# Patient Record
Sex: Male | Born: 1970 | Race: White | Hispanic: No | Marital: Married | Smoking: Never smoker
Health system: Southern US, Community
[De-identification: ages and names within clinical notes are randomized; demographics above are authoritative.]

## PROBLEM LIST (undated history)

## (undated) DIAGNOSIS — I10 Essential (primary) hypertension: Secondary | ICD-10-CM

## (undated) DIAGNOSIS — N200 Calculus of kidney: Secondary | ICD-10-CM

## (undated) HISTORY — DX: Calculus of kidney: N20.0

## (undated) HISTORY — PX: ANKLE FRACTURE SURGERY: SHX122

## (undated) HISTORY — DX: Essential (primary) hypertension: I10

## (undated) HISTORY — PX: TONSILLECTOMY: SUR1361

---

## 2001-03-31 ENCOUNTER — Emergency Department (HOSPITAL_COMMUNITY): Admission: EM | Admit: 2001-03-31 | Discharge: 2001-03-31 | Payer: Self-pay | Admitting: Emergency Medicine

## 2014-10-14 ENCOUNTER — Encounter: Payer: Self-pay | Admitting: Family Medicine

## 2014-10-14 ENCOUNTER — Ambulatory Visit (INDEPENDENT_AMBULATORY_CARE_PROVIDER_SITE_OTHER): Payer: Managed Care, Other (non HMO) | Admitting: Family Medicine

## 2014-10-14 VITALS — BP 132/100 | HR 79 | Temp 97.9°F | Ht 69.5 in | Wt 252.4 lb

## 2014-10-14 DIAGNOSIS — I10 Essential (primary) hypertension: Secondary | ICD-10-CM

## 2014-10-14 DIAGNOSIS — K219 Gastro-esophageal reflux disease without esophagitis: Secondary | ICD-10-CM

## 2014-10-14 DIAGNOSIS — E669 Obesity, unspecified: Secondary | ICD-10-CM

## 2014-10-14 DIAGNOSIS — Z23 Encounter for immunization: Secondary | ICD-10-CM

## 2014-10-14 MED ORDER — LISINOPRIL-HYDROCHLOROTHIAZIDE 10-12.5 MG PO TABS: 1.0000 | ORAL_TABLET | Freq: Every day | ORAL | Status: DC

## 2014-10-14 NOTE — Patient Instructions (Signed)
BEFORE YOU LEAVE: -schedule physical in 1-2 months and follow up for you blood pressure -flu shot today  -stop the supplements  -RESTART the blood pressure medication  -try trial off of the acid reflux medication  We recommend the following healthy lifestyle measures: - eat a healthy diet consisting of lots of vegetables, fruits, beans, nuts, seeds, healthy meats such as white chicken and fish and whole grains.  - avoid fried foods, fast food, processed foods, sodas, red meet and other fattening foods.  - get a least 150 minutes of aerobic exercise per week.

## 2014-10-14 NOTE — Progress Notes (Signed)
HPI:  Douglas Webb is here to establish care. Used to go to urgent care. Friend is Dr. Phoebe Perchtelin - a urologist and he always helps him if needs anything. But now has HTN and needs PCP. Has the following chronic problems that require follow up and concerns today:  HTN: -dx last year -has been working on diet and exercise -lisinopril-hctz prescribed in urgent care - he ran out 4 days ago -doing 3 miles on treadmill daily, working on diet, has lost 24 lbs on this regimen in 4 months -denies: CP, SOB, DOE, swelling, palpitations, HA -FH hypertension in father  GERD: -on ppi -no heartburn or dysphagia  Allergic Rhinitis: -takes flonase -allovert -reports is doing well currently  ROS negative for unless reported above: fevers, unintentional weight loss, hearing or vision loss, chest pain, palpitations, struggling to breath, hemoptysis, melena, hematochezia, hematuria, falls, loc, si, thoughts of self harm  Past Medical History  Diagnosis Date  . Hypertension   . Kidney stone     Past Surgical History  Procedure Laterality Date  . Tonsillectomy    . Ankle fracture surgery Bilateral     Family History  Problem Relation Age of Onset  . Hypertension Father   . Cancer Father     lung cancer, smoker    History   Social History  . Marital Status: Married    Spouse Name: N/A    Number of Children: N/A  . Years of Education: N/A   Social History Main Topics  . Smoking status: Never Smoker   . Smokeless tobacco: None  . Alcohol Use: 0.0 oz/week    0 Not specified per week     Comment: occ rare alcohol use  . Drug Use: No  . Sexual Activity: None   Other Topics Concern  . None   Social History Narrative   Work or School: Horticulturist, [employer], Programmer, [employer] [place]      Home Situation: wife, son, 2 step kids      Spiritual Beliefs: none, catholic      Lifestyle: 3 miles per day of exercise; healthy diet          Current outpatient prescriptions:  fluticasone (FLONASE) 50 MCG/ACT nasal spray, Place into both nostrils daily., Disp: , Rfl: ;  lisinopril-hydrochlorothiazide (PRINZIDE,ZESTORETIC) 10-12.5 MG per tablet, Take 1 tablet by mouth daily., Disp: 90 tablet, Rfl: 3;  NON FORMULARY, Juice plus, Disp: , Rfl: ;  NON FORMULARY, Forskohlii, Disp: , Rfl: ;  NON FORMULARY, Isagenix lean shake and cleanse, Disp: , Rfl:  omeprazole (PRILOSEC) 20 MG capsule, Take 20 mg by mouth daily., Disp: , Rfl:   EXAM:  Filed Vitals:   10/14/14 1440  BP: 132/100  Pulse: 79  Temp: 97.9 F (36.6 C)    Body mass index is 36.75 kg/(m^2).  GENERAL: vitals reviewed and listed above, alert, oriented, appears well hydrated and in no acute distress  HEENT: atraumatic, conjunttiva clear, no obvious abnormalities on inspection of external nose and ears  NECK: no obvious masses on inspection  LUNGS: clear to auscultation bilaterally, no wheezes, rales or rhonchi, good air movement  CV: HRRR, no peripheral edema  MS: moves all extremities without noticeable abnormality  PSYCH: pleasant and cooperative, no obvious depression or anxiety  ASSESSMENT AND PLAN:  Discussed the following assessment and plan:  Essential hypertension - Plan: lisinopril-hydrochlorothiazide (PRINZIDE,ZESTORETIC) 10-12.5 MG per tablet  Obesity  Gastroesophageal reflux disease, esophagitis presence not specified   -We reviewed the PMH, PSH, FH,  SH, Meds and Allergies. -We provided refills for any medications we will prescribe as needed. -We addressed current concerns per orders and patient instructions. -We have asked for records for pertinent exams, studies, vaccines and notes from previous providers. -We have advised patient to follow up per instructions below. -restart BP meds, congratulated and encourage on lifestyle changes and supported at length -close follow up on BP and physical -discussed risks with supplement quality and advised to stop supplements -advised  trial of PPI and close follow up  -Patient advised to return or notify a doctor immediately if symptoms worsen or persist or new concerns arise.  Patient Instructions  BEFORE YOU LEAVE: -schedule physical in 1-2 months and follow up for you blood pressure -flu shot today  -stop the supplements  -RESTART the blood pressure medication  -try trial off of the acid reflux medication  We recommend the following healthy lifestyle measures: - eat a healthy diet consisting of lots of vegetables, fruits, beans, nuts, seeds, healthy meats such as white chicken and fish and whole grains.  - avoid fried foods, fast food, processed foods, sodas, red meet and other fattening foods.  - get a least 150 minutes of aerobic exercise per week.        Kriste BasqueKIM, Bryla Burek R.

## 2014-10-14 NOTE — Addendum Note (Signed)
Addended by: Johnella MoloneyFUNDERBURK, Gaylynn Seiple A on: 10/14/2014 03:36 PM   Modules accepted: Orders

## 2014-10-14 NOTE — Progress Notes (Signed)
Pre visit review using our clinic review tool, if applicable. No additional management support is needed unless otherwise documented below in the visit note. 

## 2014-10-20 ENCOUNTER — Telehealth: Payer: Self-pay | Admitting: Family Medicine

## 2014-10-20 NOTE — Telephone Encounter (Signed)
emmi emailed °

## 2014-12-16 ENCOUNTER — Ambulatory Visit (INDEPENDENT_AMBULATORY_CARE_PROVIDER_SITE_OTHER): Payer: Managed Care, Other (non HMO) | Admitting: Family Medicine

## 2014-12-16 ENCOUNTER — Encounter: Payer: Self-pay | Admitting: Family Medicine

## 2014-12-16 VITALS — BP 122/86 | HR 70 | Temp 97.9°F | Ht 69.5 in | Wt 260.1 lb

## 2014-12-16 DIAGNOSIS — I10 Essential (primary) hypertension: Secondary | ICD-10-CM

## 2014-12-16 DIAGNOSIS — Z0000 Encounter for general adult medical examination without abnormal findings: Secondary | ICD-10-CM

## 2014-12-16 DIAGNOSIS — W540XXA Bitten by dog, initial encounter: Secondary | ICD-10-CM

## 2014-12-16 DIAGNOSIS — Z23 Encounter for immunization: Secondary | ICD-10-CM

## 2014-12-16 DIAGNOSIS — T148 Other injury of unspecified body region: Secondary | ICD-10-CM

## 2014-12-16 DIAGNOSIS — E669 Obesity, unspecified: Secondary | ICD-10-CM

## 2014-12-16 NOTE — Patient Instructions (Addendum)
BEFORE YOU LEAVE: -schedule fasting labs - drink plenty of water that morning -follow up appointment in 3 months -Tdap  We recommend the following healthy lifestyle measures: - eat a healthy diet consisting of lots of vegetables, fruits, beans, nuts, seeds, healthy meats such as white chicken and fish and whole grains.  - avoid fried foods, fast food, processed foods, sodas, red meet and other fattening foods.  - get a least 150 minutes of aerobic exercise per week.   Seek care immediately if wound on hand worsening or not healing

## 2014-12-16 NOTE — Progress Notes (Signed)
Pre visit review using our clinic review tool, if applicable. No additional management support is needed unless otherwise documented below in the visit note. 

## 2014-12-16 NOTE — Addendum Note (Signed)
Addended by: Sallee LangeFUNDERBURK, JO A on: 12/16/2014 11:40 AM   Modules accepted: Orders

## 2014-12-16 NOTE — Progress Notes (Signed)
HPI:  Here for CPE:  -Concerns and/or follow up today:   HTN: -dx last year -reports was bad over the holiday, but better now -lisinopril-hctz  -doing 3 miles on treadmill daily, working on diet, has lost 24 lbs on this regimen in 4 months, then gained some back over holidays -denies: CP, SOB, DOE, swelling, palpitations, HA -FH hypertension in father  GERD: -on ppi -no heartburn or dysphagia  Allergic Rhinitis: -takes flonase -allovert -reports is doing well currently  Dog bite: R thumb Cleaned and treated in ucc and reports healing well Denies: pain, trouble bending finger, redness, swelling  -Diet: variety of foods, balance and well rounded - since new year healthy  -Exercise: regular exercise - 3 miles per day treadmill started recently  -Diabetes and Dyslipidemia Screening: not fasting  -Hx of HTN: no  -Vaccines: Tdap offered  -sexual activity: yes, male partner, no new partners  -wants STI testing, Hep C screening (if born 63-1966): no  -FH colon or prstate ca: see FH Last colon cancer screening: n/a Last prostate ca screening: n/a  -Alcohol, Tobacco, drug use: see social history  Review of Systems - no fevers, unintentional weight loss, vision loss, hearing loss, chest pain, sob, hemoptysis, melena, hematochezia, hematuria, genital discharge, changing or concerning skin lesions, bleeding, bruising, loc, thoughts of self harm or SI  Past Medical History  Diagnosis Date  . Hypertension   . Kidney stone     Past Surgical History  Procedure Laterality Date  . Tonsillectomy    . Ankle fracture surgery Bilateral     Family History  Problem Relation Age of Onset  . Hypertension Father   . Cancer Father     lung cancer, smoker    History   Social History  . Marital Status: Married    Spouse Name: N/A    Number of Children: N/A  . Years of Education: N/A   Social History Main Topics  . Smoking status: Never Smoker   . Smokeless  tobacco: None  . Alcohol Use: 0.0 oz/week    0 Not specified per week     Comment: occ rare alcohol use  . Drug Use: No  . Sexual Activity: None   Other Topics Concern  . None   Social History Narrative   Work or School: Horticulturist, commercial, Programmer, [employer] [place]      Home Situation: wife, son, 2 step kids      Spiritual Beliefs: none, catholic      Lifestyle: 3 miles per day of exercise; healthy diet           Current outpatient prescriptions:  .  fluticasone (FLONASE) 50 MCG/ACT nasal spray, Place into both nostrils daily., Disp: , Rfl:  .  lisinopril-hydrochlorothiazide (PRINZIDE,ZESTORETIC) 10-12.5 MG per tablet, Take 1 tablet by mouth daily., Disp: 90 tablet, Rfl: 3 .  NON FORMULARY, Juice plus, Disp: , Rfl:  .  NON FORMULARY, Isagenix lean shake and cleanse, Disp: , Rfl:  .  omeprazole (PRILOSEC) 20 MG capsule, Take 20 mg by mouth daily., Disp: , Rfl:   EXAM:  Filed Vitals:   12/16/14 0933  BP: 122/86  Pulse: 70  Temp: 97.9 F (36.6 C)  TempSrc: Oral  Height: 5' 9.5" (1.765 m)  Weight: 260 lb 1.6 oz (117.981 kg)    Estimated body mass index is 37.87 kg/(m^2) as calculated from the following:   Height as of this encounter: 5' 9.5" (1.765 m).   Weight as of this encounter: 260 lb  1.6 oz (117.981 kg).  GENERAL: vitals reviewed and listed below, alert, oriented, appears well hydrated and in no acute distress  HEENT: head atraumatic, PERRLA, normal appearance of eyes, ears, nose and mouth. moist mucus membranes.  NECK: supple, no masses or lymphadenopathy  LUNGS: clear to auscultation bilaterally, no rales, rhonchi or wheeze  CV: HRRR, no peripheral edema or cyanosis, normal pedal pulses  ABDOMEN: bowel sounds normal, soft, non tender to palpation, no masses, no rebound or guarding  GU: declined  RECTAL: refused  SKIN: healing lesion R thumb, no erythema or welling or signs of infection  MS: normal gait, moves all extremities normally  NEURO:  CN II-XII grossly intact, normal muscle strength and sensation to light touch on extremities  PSYCH: normal affect, pleasant and cooperative  ASSESSMENT AND PLAN:  Discussed the following assessment and plan:  Essential hypertension - Plan: Basic metabolic panel  Visit for preventive health examination - Plan: Lipid Panel, Hemoglobin A1c  Obesity - Plan: Lipid Panel, Hemoglobin A1c  Dog bite   -Discussed and advised all [place] preventive services health task force level A and B recommendations for age, sex and risks.  -Advised at least 150 minutes of exercise per week and a healthy diet low in saturated fats and sweets and consisting of fresh fruits and vegetables, lean meats such as fish and white chicken and whole grains.  -FASTING labs, studies and vaccines per orders this encounter   Patient advised to return to clinic immediately if symptoms worsen or persist or new concerns.  Patient Instructions  BEFORE YOU LEAVE: -schedule fasting labs - drink plenty of water that morning -follow up appointment in 3 months -Tdap  We recommend the following healthy lifestyle measures: - eat a healthy diet consisting of lots of vegetables, fruits, beans, nuts, seeds, healthy meats such as white chicken and fish and whole grains.  - avoid fried foods, fast food, processed foods, sodas, red meet and other fattening foods.  - get a least 150 minutes of aerobic exercise per week.   Seek care immediately if wound on hand worsening or not healing      Return in about 3 months (around 03/17/2015) for follow up.   Kriste BasqueKIM, HANNAH R.

## 2014-12-22 ENCOUNTER — Other Ambulatory Visit (INDEPENDENT_AMBULATORY_CARE_PROVIDER_SITE_OTHER): Payer: Managed Care, Other (non HMO)

## 2014-12-22 DIAGNOSIS — I10 Essential (primary) hypertension: Secondary | ICD-10-CM

## 2014-12-22 DIAGNOSIS — E669 Obesity, unspecified: Secondary | ICD-10-CM

## 2014-12-22 DIAGNOSIS — Z0000 Encounter for general adult medical examination without abnormal findings: Secondary | ICD-10-CM

## 2014-12-22 LAB — LIPID PANEL
CHOL/HDL RATIO: 4
Cholesterol: 197 mg/dL (ref 0–200)
HDL: 48.7 mg/dL (ref >39.00)
NONHDL: 148.3
TRIGLYCERIDES: 208 mg/dL — AB (ref 0.0–149.0)
VLDL: 41.6 mg/dL — ABNORMAL HIGH (ref 0.0–40.0)

## 2014-12-22 LAB — HEMOGLOBIN A1C: Hgb A1c MFr Bld: 5.7 % (ref 4.6–6.5)

## 2014-12-22 LAB — BASIC METABOLIC PANEL
BUN: 13 mg/dL (ref 6–23)
CALCIUM: 9.4 mg/dL (ref 8.4–10.5)
CHLORIDE: 103 meq/L (ref 96–112)
CO2: 31 meq/L (ref 19–32)
Creatinine, Ser: 0.91 mg/dL (ref 0.40–1.50)
GFR: 96.08 mL/min (ref >60.00)
GLUCOSE: 101 mg/dL — AB (ref 70–99)
Potassium: 3.9 mEq/L (ref 3.5–5.1)
Sodium: 139 mEq/L (ref 135–145)

## 2014-12-22 LAB — LDL CHOLESTEROL, DIRECT: LDL DIRECT: 68 mg/dL

## 2015-03-18 ENCOUNTER — Ambulatory Visit (INDEPENDENT_AMBULATORY_CARE_PROVIDER_SITE_OTHER): Payer: Managed Care, Other (non HMO) | Admitting: Family Medicine

## 2015-03-18 ENCOUNTER — Encounter: Payer: Self-pay | Admitting: Family Medicine

## 2015-03-18 VITALS — BP 128/90 | HR 77 | Temp 98.2°F | Ht 69.5 in | Wt 252.4 lb

## 2015-03-18 DIAGNOSIS — J309 Allergic rhinitis, unspecified: Secondary | ICD-10-CM

## 2015-03-18 DIAGNOSIS — Z6836 Body mass index (BMI) 36.0-36.9, adult: Secondary | ICD-10-CM | POA: Diagnosis not present

## 2015-03-18 DIAGNOSIS — K219 Gastro-esophageal reflux disease without esophagitis: Secondary | ICD-10-CM | POA: Diagnosis not present

## 2015-03-18 DIAGNOSIS — I10 Essential (primary) hypertension: Secondary | ICD-10-CM

## 2015-03-18 NOTE — Progress Notes (Signed)
  HPI:  Follow up:  HTN: -meds: lisinopril-hctz 10-12.5 -denies: CP, SOB, DOE, palpiations  Obesity: -diet: improving -exercise: exercising on a regular basis -improving has lost weight  GERD: -stable -meds: prilosec -denies: dysphagia, vomiting  Allergic Rhinitis: -meds: allegra and flonase -nasal and eye symptoms primarily - flonase working well -denies: SOB, wheezing   ROS: See pertinent positives and negatives per HPI.  Past Medical History  Diagnosis Date  . Hypertension   . Kidney stone     Past Surgical History  Procedure Laterality Date  . Tonsillectomy    . Ankle fracture surgery Bilateral     Family History  Problem Relation Age of Onset  . Hypertension Father   . Cancer Father     lung cancer, smoker    History   Social History  . Marital Status: Married    Spouse Name: N/A  . Number of Children: N/A  . Years of Education: N/A   Social History Main Topics  . Smoking status: Never Smoker   . Smokeless tobacco: Not on file  . Alcohol Use: 0.0 oz/week    0 Standard drinks or equivalent per week     Comment: occ rare alcohol use  . Drug Use: No  . Sexual Activity: Not on file   Other Topics Concern  . 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  fexofenadine (ALLEGRA) 180 MG tablet, Take 180 mg by mouth daily., Disp: , Rfl:  .  fluticasone (FLONASE) 50 MCG/ACT nasal spray, Place into both nostrils daily., Disp: , Rfl:  .  lisinopril-hydrochlorothiazide (PRINZIDE,ZESTORETIC) 10-12.5 MG per tablet, Take 1 tablet by mouth daily., Disp: 90 tablet, Rfl: 3 .  omeprazole (PRILOSEC) 20 MG capsule, Take 20 mg by mouth daily., Disp: , Rfl:   EXAM:  Filed Vitals:   03/18/15 0858  BP: 128/90   Pulse: 77  Temp: 98.2 F (36.8 C)    Body mass index is 36.75 kg/(m^2).  GENERAL: vitals reviewed and listed above, alert, oriented, appears well hydrated and in no acute distress  HEENT: atraumatic, conjunttiva clear, no obvious abnormalities on inspection of external nose and ears  NECK: no obvious masses on inspection  LUNGS: clear to auscultation bilaterally, no wheezes, rales or rhonchi, good air movement  CV: HRRR, no peripheral edema  MS: moves all extremities without noticeable abnormality  PSYCH: pleasant and cooperative, no obvious depression or anxiety  ASSESSMENT AND PLAN:  Discussed the following assessment and plan:  Essential hypertension -stable - congratulated on lifestyle changes and advised continue medication therapy  Gastroesophageal reflux disease, esophagitis presence not specified -stable  BMI 36.0-36.9,adult -lifestyle changes  Allergic rhinitis, unspecified allergic rhinitis type -discussed other treatment options - he opted to continue his current medications and allergen   -Patient advised to return or notify a doctor immediately if symptoms worsen or persist or new concerns arise.  There are no Patient Instructions on file for this visit.   Kriste BasqueKIM, Kelsay Haggard R.

## 2015-03-18 NOTE — Progress Notes (Signed)
Pre visit review using our clinic review tool, if applicable. No additional management support is needed unless otherwise documented below in the visit note. 

## 2015-07-18 ENCOUNTER — Ambulatory Visit (INDEPENDENT_AMBULATORY_CARE_PROVIDER_SITE_OTHER): Payer: Managed Care, Other (non HMO) | Admitting: Family Medicine

## 2015-07-18 ENCOUNTER — Encounter: Payer: Self-pay | Admitting: Family Medicine

## 2015-07-18 VITALS — BP 130/88 | HR 56 | Temp 97.9°F | Ht 69.5 in | Wt 245.6 lb

## 2015-07-18 DIAGNOSIS — I10 Essential (primary) hypertension: Secondary | ICD-10-CM

## 2015-07-18 DIAGNOSIS — K219 Gastro-esophageal reflux disease without esophagitis: Secondary | ICD-10-CM | POA: Diagnosis not present

## 2015-07-18 DIAGNOSIS — E669 Obesity, unspecified: Secondary | ICD-10-CM

## 2015-07-18 NOTE — Progress Notes (Signed)
HPI:  HTN: -meds: lisinopril-hctz 10-12.5 -denies: CP, SOB, DOE, palpiations  Obesity: -diet: improving -exercise: exercising on a regular basis (5.5 miles or [employer] daily) and doing complete nutrition and taking some supplements -improving has lost weight  GERD: -stable -meds: prilosec -denies: dysphagia, vomiting  Allergic Rhinitis: -meds: allegra and flonase -nasal and eye symptoms primarily - flonase working well -denies: SOB, wheezing  ROS: See pertinent positives and negatives per HPI.  Past Medical History  Diagnosis Date  . Hypertension   . Kidney stone     Past Surgical History  Procedure Laterality Date  . Tonsillectomy    . Ankle fracture surgery Bilateral     Family History  Problem Relation Age of Onset  . Hypertension Father   . Cancer Father     lung cancer, smoker    Social History   Social History  . Marital Status: Married    Spouse Name: N/A  . Number of Children: N/A  . Years of Education: N/A   Social History Main Topics  . Smoking status: Never Smoker   . Smokeless tobacco: None  . Alcohol Use: 0.0 oz/week    0 Standard drinks or equivalent per week     Comment: occ rare alcohol use  . Drug Use: No  . Sexual Activity: Not Asked   Other Topics Concern  . None   Social History Narrative   Work or School: Horticulturist, commercial, Programmer, [employer] [place]      Home Situation: wife, son, 2 step kids      Spiritual Beliefs: none, catholic      Lifestyle: 3 miles per day of exercise; healthy diet           Current outpatient prescriptions:  .  fexofenadine (ALLEGRA) 180 MG tablet, Take 180 mg by mouth daily., Disp: , Rfl:  .  fluticasone (FLONASE) 50 MCG/ACT nasal spray, Place into both nostrils daily., Disp: , Rfl:  .  lisinopril-hydrochlorothiazide (PRINZIDE,ZESTORETIC) 10-12.5 MG per tablet, Take 1 tablet by mouth daily., Disp: 90 tablet, Rfl: 3 .  NON FORMULARY, Supplements from [employer], Disp: , Rfl:   .  omeprazole (PRILOSEC) 20 MG capsule, Take 20 mg by mouth daily., Disp: , Rfl:   EXAM:  Filed Vitals:   07/18/15 1003  BP: 130/88  Pulse: 56  Temp: 97.9 F (36.6 C)    Body mass index is 35.76 kg/(m^2).  GENERAL: vitals reviewed and listed above, alert, oriented, appears well hydrated and in no acute distress  HEENT: atraumatic, conjunttiva clear, no obvious abnormalities on inspection of external nose and ears  NECK: no obvious masses on inspection  LUNGS: clear to auscultation bilaterally, no wheezes, rales or rhonchi, good air movement  CV: HRRR, no peripheral edema  MS: moves all extremities without noticeable abnormality  PSYCH: pleasant and cooperative, no obvious depression or anxiety  ASSESSMENT AND PLAN:  Discussed the following assessment and plan:  Essential hypertension  Gastroesophageal reflux disease, esophagitis presence not specified  Obesity  -continue healthy lifestyle -discuss risks of supplements -stop supplements -follow up in 4 months -Patient advised to return or notify a doctor immediately if symptoms worsen or persist or new concerns arise.  Patient Instructions  BEFORE YOU LEAVE: -schedule follow up in 4 months  We recommend the following healthy lifestyle measures: - eat a healthy diet consisting of small portions of vegetables, fruits, beans, nuts, seeds, healthy meats such as white chicken and fish and whole grains.  - avoid fried foods, starches, sweets,  fast food, processed foods, sodas, red meet and other fattening foods.  - get a least 150 minutes of aerobic exercise per week.       Kriste Basque R.

## 2015-07-18 NOTE — Patient Instructions (Signed)
BEFORE YOU LEAVE: -schedule follow up in 4 months  We recommend the following healthy lifestyle measures: - eat a healthy diet consisting of small portions of vegetables, fruits, beans, nuts, seeds, healthy meats such as white chicken and fish and whole grains.  - avoid fried foods, starches, sweets, fast food, processed foods, sodas, red meet and other fattening foods.  - get a least 150 minutes of aerobic exercise per week.

## 2015-07-18 NOTE — Progress Notes (Signed)
Pre visit review using our clinic review tool, if applicable. No additional management support is needed unless otherwise documented below in the visit note. 

## 2015-09-13 ENCOUNTER — Other Ambulatory Visit: Payer: Self-pay | Admitting: Family Medicine

## 2015-11-17 ENCOUNTER — Ambulatory Visit (INDEPENDENT_AMBULATORY_CARE_PROVIDER_SITE_OTHER): Payer: Managed Care, Other (non HMO) | Admitting: Family Medicine

## 2015-11-17 ENCOUNTER — Encounter: Payer: Self-pay | Admitting: Family Medicine

## 2015-11-17 VITALS — BP 132/88 | HR 62 | Temp 97.6°F | Ht 69.5 in | Wt 252.1 lb

## 2015-11-17 DIAGNOSIS — E669 Obesity, unspecified: Secondary | ICD-10-CM | POA: Diagnosis not present

## 2015-11-17 DIAGNOSIS — Z23 Encounter for immunization: Secondary | ICD-10-CM | POA: Diagnosis not present

## 2015-11-17 DIAGNOSIS — K219 Gastro-esophageal reflux disease without esophagitis: Secondary | ICD-10-CM

## 2015-11-17 DIAGNOSIS — I10 Essential (primary) hypertension: Secondary | ICD-10-CM

## 2015-11-17 NOTE — Patient Instructions (Signed)
BEFORE YOU LEAVE: -flu shot -Physical exam in 3-4 months - come fasting (no food for 8 hours prior to appointment) but drink plenty of water  We recommend the following healthy lifestyle measures: - eat a healthy whole foods diet consisting of regular small meals composed of vegetables, fruits, beans, nuts, seeds, healthy meats such as white chicken and fish and whole grains.  - avoid sweets, white starchy foods, fried foods, fast food, processed foods, sodas, red meet and other fattening foods.  - get a least 150-300 minutes of aerobic exercise per week.

## 2015-11-17 NOTE — Progress Notes (Signed)
Pre visit review using our clinic review tool, if applicable. No additional management support is needed unless otherwise documented below in the visit note. 

## 2015-11-17 NOTE — Progress Notes (Signed)
HPI:  Follow up:  HTN: -meds: lisinopril-hctz 10-12.5 - missed yesterday and just took a few minutes ago -denies: CP, SOB, DOE, palpiations  Obesity: -diet: not as good the last few weeks with holidays -exercise: exercising daily, using juice plus  GERD: -stable -meds: prilosec -denies: dysphagia, vomiting    ROS: See pertinent positives and negatives per HPI.  Past Medical History  Diagnosis Date  . Hypertension   . Kidney stone     Past Surgical History  Procedure Laterality Date  . Tonsillectomy    . Ankle fracture surgery Bilateral     Family History  Problem Relation Age of Onset  . Hypertension Father   . Cancer Father     lung cancer, smoker    Social History   Social History  . Marital Status: Married    Spouse Name: N/A  . Number of Children: N/A  . Years of Education: N/A   Social History Main Topics  . Smoking status: Never Smoker   . Smokeless tobacco: None  . Alcohol Use: 0.0 oz/week    0 Standard drinks or equivalent per week     Comment: occ rare alcohol use  . Drug Use: No  . Sexual Activity: Not Asked   Other Topics Concern  . 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  fexofenadine (ALLEGRA) 180 MG tablet, Take 180 mg by mouth daily., Disp: , Rfl:  .  fluticasone (FLONASE) 50 MCG/ACT nasal spray, Place into both nostrils daily., Disp: , Rfl:  .  lisinopril-hydrochlorothiazide (PRINZIDE,ZESTORETIC) 10-12.5 MG tablet, TAKE 1 TABLET BY MOUTH DAILY, Disp: 90 tablet, Rfl: 0 .  NON FORMULARY, Supplements from [employer] [facility], Disp: , Rfl:  .  omeprazole (PRILOSEC) 20 MG capsule, Take 20 mg by mouth daily., Disp: , Rfl:   EXAM:  Filed Vitals:   11/17/15 0846  BP: 132/88   Pulse: 62  Temp: 97.6 F (36.4 C)    Body mass index is 36.71 kg/(m^2).  GENERAL: vitals reviewed and listed above, alert, oriented, appears well hydrated and in no acute distress  HEENT: atraumatic, conjunttiva clear, no obvious abnormalities on inspection of external nose and ears  NECK: no obvious masses on inspection  LUNGS: clear to auscultation bilaterally, no wheezes, rales or rhonchi, good air movement  CV: HRRR, no peripheral edema  MS: moves all extremities without noticeable abnormality  PSYCH: pleasant and cooperative, no obvious depression or anxiety  ASSESSMENT AND PLAN:  Discussed the following assessment and plan:  Essential hypertension  Obesity  Gastroesophageal reflux disease, esophagitis presence not specified   -lifestyle recs - has gained a little, likely related to diet - encouraged getting back to healthy diet and congratulated on maintaining exercise -discussed supplements and quality issues -labs at next visit -Patient advised to return or notify a doctor immediately if symptoms worsen or persist or new concerns arise.  Patient Instructions  BEFORE YOU LEAVE: -flu shot -Physical exam in 3-4 months - come fasting (no food for 8 hours prior to appointment) but drink plenty of water  We recommend the following healthy lifestyle measures: - eat a healthy whole foods diet consisting of regular small meals  composed of vegetables, fruits, beans, nuts, seeds, healthy meats such as white chicken and fish and whole grains.  - avoid sweets, white starchy foods, fried foods, fast food, processed foods, sodas, red meet and other fattening foods.  - get a least 150-300 minutes of aerobic exercise per week.       Douglas Webb R.

## 2016-01-14 ENCOUNTER — Other Ambulatory Visit: Payer: Self-pay | Admitting: Family Medicine

## 2016-03-14 ENCOUNTER — Ambulatory Visit (INDEPENDENT_AMBULATORY_CARE_PROVIDER_SITE_OTHER): Payer: Managed Care, Other (non HMO) | Admitting: Family Medicine

## 2016-03-14 DIAGNOSIS — R69 Illness, unspecified: Secondary | ICD-10-CM

## 2016-03-14 NOTE — Progress Notes (Signed)
LATE CANCEL/NO SHOW FOR CPE

## 2016-07-15 ENCOUNTER — Other Ambulatory Visit: Payer: Self-pay | Admitting: Family Medicine

## 2016-07-15 DIAGNOSIS — I10 Essential (primary) hypertension: Secondary | ICD-10-CM

## 2016-07-23 ENCOUNTER — Ambulatory Visit: Payer: Managed Care, Other (non HMO) | Admitting: Neurology

## 2016-07-27 ENCOUNTER — Ambulatory Visit (INDEPENDENT_AMBULATORY_CARE_PROVIDER_SITE_OTHER): Payer: Managed Care, Other (non HMO) | Admitting: Neurology

## 2016-07-27 ENCOUNTER — Encounter: Payer: Self-pay | Admitting: Neurology

## 2016-07-27 VITALS — BP 139/88 | HR 59 | Ht 71.0 in | Wt 262.2 lb

## 2016-07-27 DIAGNOSIS — R202 Paresthesia of skin: Secondary | ICD-10-CM | POA: Diagnosis not present

## 2016-07-27 DIAGNOSIS — M25512 Pain in left shoulder: Secondary | ICD-10-CM | POA: Diagnosis not present

## 2016-07-27 DIAGNOSIS — R531 Weakness: Secondary | ICD-10-CM

## 2016-07-27 DIAGNOSIS — R29898 Other symptoms and signs involving the musculoskeletal system: Secondary | ICD-10-CM | POA: Diagnosis not present

## 2016-07-27 DIAGNOSIS — M50121 Cervical disc disorder at C4-C5 level with radiculopathy: Secondary | ICD-10-CM | POA: Diagnosis not present

## 2016-07-27 DIAGNOSIS — M6289 Other specified disorders of muscle: Secondary | ICD-10-CM

## 2016-07-27 DIAGNOSIS — M79602 Pain in left arm: Secondary | ICD-10-CM

## 2016-07-27 DIAGNOSIS — M12812 Other specific arthropathies, not elsewhere classified, left shoulder: Secondary | ICD-10-CM

## 2016-07-27 DIAGNOSIS — M542 Cervicalgia: Secondary | ICD-10-CM

## 2016-07-27 DIAGNOSIS — S46012A Strain of muscle(s) and tendon(s) of the rotator cuff of left shoulder, initial encounter: Secondary | ICD-10-CM

## 2016-07-27 DIAGNOSIS — M67912 Unspecified disorder of synovium and tendon, left shoulder: Secondary | ICD-10-CM | POA: Diagnosis not present

## 2016-07-27 DIAGNOSIS — S46812S Strain of other muscles, fascia and tendons at shoulder and upper arm level, left arm, sequela: Secondary | ICD-10-CM

## 2016-07-27 DIAGNOSIS — M6281 Muscle weakness (generalized): Secondary | ICD-10-CM | POA: Diagnosis not present

## 2016-07-27 DIAGNOSIS — M5010 Cervical disc disorder with radiculopathy, unspecified cervical region: Secondary | ICD-10-CM

## 2016-07-27 NOTE — Progress Notes (Addendum)
[facility]    Provider:  Dr Lucia GaskinsAhern Referring Provider: Terressa KoyanagiKim, Hannah R, DO, Dr. Thereasa DistanceJeremy Phillips chiropractor Primary Care Physician:  Terressa KoyanagiKIM, HANNAH R., DO  CC:  Arm and neck pain  HPI:  Douglas Webb is a 45 y.o. male here as a referral from Dr. Selena BattenKim and Dr. Thereasa DistanceJeremy Phillips for left arm pain. Left arm weakness has been chronic for the last 3-4 years. He endorses weakness proximally and pain with burning radiating in the proximal left arm, rotator cuff and cervical musculature. Weakness mostly  on left arm abduction and actions requiring external rotation. Started 3-4 years ago and progressive. He was working out a lot at the time of the onset of weakness and driving long trips with his left arm and sleeping on the left side which worsened his weakness and pain. He had physical therapy and weakness minimally improved. He was supposed to have an emg/ncs but never had it. He feels radiation and burning into the left paraspinal cervical muscles, shoulder, proximal arm and rotator cuff. The weakness is mostly in the lifting the arm laterally, worse with driving. Worse with use especially on ipad he feels weak using a mouse. Better with rest. He feels weak in the left proximal muscles, he denies any distal weakness or weakness in grip strength or dropping objects from his hand.  His arm gets tired frequently. resting it and keeping it at his side helps. Lyrica did not help in the past and ibuprofen does not help either. Feels tired and weak. Marland Kitchen. He feels much better with stretching and manipualtion of his neck especially neck traction. He has associated muscle spasms in the deltoid and biceps. [facility] 3-4 years ago and had an MRI of the cervical spine and an MRI of his shoulder completed 3-4 years ago with results below. Arm symptoms significantly impairing his ability to drive and also use arm on the computer. Progressive and worsening. No other  associated symptoms, focal neurologic deficits or complaints. No changes in bowel and bladder, no retention or incontinence of bowel or bladder. No gait abnormalities.  Reviewed notes, labs and imaging from outside physicians, which showed:   BMP in January 2016 was essentially normal with normal creatinine 0.91, LDL 68, hemoglobin A1c 5.7  MRI of the cervical spine in 2014 report (do not have images to review) showed focal degenerative disc changes at C3-C4 and C4-C5 causing foraminal stenosis most notable at C4-C5 where left uncovertebral spurring and probable foraminal disc herniation resulting in moderate to advanced left foraminal stenosis. (possible left C5 nerve root impingement)  MRI of the shoulder including the rotator cuff cuff muscles significant for: Supraspinatus tendinopathy, subscapularis tendon fraying of the articular surface and the critical zone, edema in the supraspinatus and infraspinatus suggesting prior strain.  Review of Systems: Patient complains of symptoms per HPI as well as the following symptoms: no CP, no SOB. Pertinent negatives per HPI. All others negative.   Social History   Social History  . Marital status: Married    Spouse name: N/A  . Number of children: N/A  . Years of education: N/A   Occupational History  . manager    Social History Main Topics  . Smoking status: Never Smoker  . Smokeless tobacco: Never Used  . Alcohol use 0.0 oz/week     Comment: occ rare alcohol use  . Drug use: No  . Sexual activity: Not on file   Other Topics Concern  .  Not on file   Social History Narrative   Work or School: Horticulturist, commercial, Programmer, [employer]      Home Situation: wife, son, 2 step kids      Spiritual Beliefs: none, catholic      Lifestyle: 3 miles per day of exercise; healthy diet          Family History  Problem Relation Age of Onset  . Hypertension Father   . Cancer Father     lung cancer, smoker    Past Medical  History:  Diagnosis Date  . Hypertension   . Kidney stone     Past Surgical History:  Procedure Laterality Date  . ANKLE FRACTURE SURGERY Bilateral   . TONSILLECTOMY      Current Outpatient Prescriptions  Medication Sig Dispense Refill  . fexofenadine (ALLEGRA) 180 MG tablet Take 180 mg by mouth daily.    . fluticasone (FLONASE) 50 MCG/ACT nasal spray Place into both nostrils daily.    Marland Kitchen lisinopril-hydrochlorothiazide (PRINZIDE,ZESTORETIC) 10-12.5 MG tablet TAKE 1 TABLET BY MOUTH DAILY 30 tablet 0  . NON FORMULARY Joice plus once daily    . omeprazole (PRILOSEC) 20 MG capsule Take 20 mg by mouth daily.     No current facility-administered medications for this visit.     Allergies as of 07/27/2016  . (No Known Allergies)    Vitals: BP 139/88   Pulse (!) 59   Ht 5\' 11"  (1.803 m)   Wt 262 lb 3.2 oz (118.9 kg)   BMI 36.57 kg/m  Last Weight:  Wt Readings from Last 1 Encounters:  07/27/16 262 lb 3.2 oz (118.9 kg)   Last Height:   Ht Readings from Last 1 Encounters:  07/27/16 5\' 11"  (1.803 m)   Physical exam: Exam: Gen: NAD, conversant, well nourised, obese, well groomed                     CV: RRR, no MRG. No Carotid Bruits. No peripheral edema, warm, nontender Eyes: Conjunctivae clear without exudates or hemorrhage MSK: Pain on palpation of the left deltoid, rotator cuff muscles, and trapezius. Normal range of motion at the shoulder. Possible atrophy of the rotator cuff muscles. No musculature hypertrophy noted. No fasciculations noted.  Neuro: Detailed Neurologic Exam  Speech:    Speech is normal; fluent and spontaneous with normal comprehension.  Cognition:    The patient is oriented to person, place, and time;     recent and remote memory intact;     language fluent;     normal attention, concentration,     fund of knowledge Cranial Nerves:    The pupils are equal, round, and reactive to light. The fundi are normal and spontaneous venous pulsations are  present. Visual fields are full to finger confrontation. Extraocular movements are intact. Trigeminal sensation is intact and the muscles of mastication are normal. The face is symmetric. The palate elevates in the midline. Hearing intact. Voice is normal. Shoulder shrug is normal. The tongue has normal motion without fasciculations.   Coordination:    Normal finger to nose and heel to shin. Normal rapid alternating movements.   Gait:    Heel-toe and tandem gait are normal.   Motor Observation:   no involuntary movements noted. Pain on palpation of the left deltoid, rotator cuff muscles, and trapezius. Normal range of motion at the shoulder. Possible atrophy of the rotator cuff muscles. No musculature hypertrophy noted. No fasciculations noted.  Tone:    Normal  muscle tone.    Posture:    Posture is normal. normal erect    Strength: Patient has weakness of the left rotator cuff musculature as well as left deltoid. Left deltoid 4+ out of 5, infraspinatus and supraspinatus weakness 4/5. Otherwise strength is V/V in the upper and lower limbs.      Sensation: intact to LT.      Reflex Exam:   DTR's: Absent ankle jerks bilaterally otherwise deep tendon reflexes in the upper and lower extremities are normal bilaterally.   Toes:    The toes are downgoing bilaterally.   Clonus:    Clonus is absent   Assessment/Plan:  45 year old male with chronic left arm weakness with radiating pain and burning into the proximal arm and shoulder. Symptoms persistent and worsening for 3-4 years. He has been to physical therapy with minimal relief, Lyrica did not help and ibuprofen does not improve symptoms. Significantly impacting his life including difficulty driving and using the arm on the computer. Previous imaging showed moderate to severe foraminal stenosis and possible impingement at C5 on the left and corresponds to root-level innervation of the proximal left arm muscles and rotator cuff muscles which  are weak on clinical exam. Also seen was mild edema in the rotator cuff muscles and tendinopathy of the rotator cuff muscles. Unclear etiology of his pain and weakness whether he continues to have tendinopathy and edema of the rotator cuff muscles or whether this is due to C5 cervical nerve root impingement or possibly both. Symptoms progressive. We'll reimage MRI of the cervical spine and rotator cuff as last imaging was 3-4 years ago. We'll also order an EMG nerve conduction study of the bilateral upper extremities including ulnar, median motor  studies, ulnar and median and radial sensory studies and see if we can get an axillary sensory conduction. as well as EMG of the left arm, rotator cuff musculature and cervical paraspinals. We'll request CDs with imaging of his previous MRI of the cervical spine and left shoulder in 2014.   Likely left C5 radiculopathy.     Naomie Dean, MD  [facility] [address] [place], [place] [address]  Phone [phone] Fax [phone]

## 2016-07-27 NOTE — Patient Instructions (Signed)
Remember to drink plenty of fluid, eat healthy meals and do not skip any meals. Try to eat protein with a every meal and eat a healthy snack such as fruit or nuts in between meals. Try to keep a regular sleep-wake schedule and try to exercise daily, particularly in the form of walking, 20-30 minutes a day, if you can.   As far as diagnostic testing: MRI c-spine and shoulder  Our phone number is [phone]. We also have an after hours call service for urgent matters and there is a physician on-call for urgent questions. For any emergencies you know to call 911 or go to the nearest emergency room

## 2016-07-31 ENCOUNTER — Encounter: Payer: Self-pay | Admitting: Neurology

## 2016-07-31 DIAGNOSIS — M5010 Cervical disc disorder with radiculopathy, unspecified cervical region: Secondary | ICD-10-CM | POA: Insufficient documentation

## 2016-08-13 ENCOUNTER — Inpatient Hospital Stay: Admission: RE | Admit: 2016-08-13 | Payer: Managed Care, Other (non HMO) | Source: Ambulatory Visit

## 2016-08-13 ENCOUNTER — Other Ambulatory Visit: Payer: Managed Care, Other (non HMO)

## 2016-08-14 ENCOUNTER — Ambulatory Visit (INDEPENDENT_AMBULATORY_CARE_PROVIDER_SITE_OTHER): Payer: Managed Care, Other (non HMO) | Admitting: Neurology

## 2016-08-14 ENCOUNTER — Ambulatory Visit (INDEPENDENT_AMBULATORY_CARE_PROVIDER_SITE_OTHER): Payer: Self-pay | Admitting: Neurology

## 2016-08-14 DIAGNOSIS — Z0289 Encounter for other administrative examinations: Secondary | ICD-10-CM

## 2016-08-14 DIAGNOSIS — M50121 Cervical disc disorder at C4-C5 level with radiculopathy: Secondary | ICD-10-CM

## 2016-08-14 NOTE — Progress Notes (Signed)
See procedure note.

## 2016-08-14 NOTE — Progress Notes (Signed)
   [facility]    Provider:  Dr Lucia GaskinsAhern Referring Provider: Terressa KoyanagiKim, Hannah R, DO Primary Care Physician:  Terressa KoyanagiKIM, HANNAH R., DO  HPI:  Douglas Webb is a 45 y.o. male here for follow up of left arm pain and weakness with radiating pain and burning into the proximal arm and shoulder. Symptoms persistent and worsening for 3-4 years. He has been to physical therapy with minimal relief, Lyrica did not help and ibuprofen does not improve symptoms. Significantly impacting his life including difficulty driving and using the arm on the computer. Previous imaging showed moderate to severe foraminal stenosis and possible impingement at C5 on the left and corresponds to root-level innervation of the proximal left arm muscles and rotator cuff muscles which are weak on clinical exam. Also seen was mild edema in the rotator cuff muscles and tendinopathy of the rotator cuff muscles. Unclear etiology of his pain and weakness whether he continues to have tendinopathy and edema of the rotator cuff muscles or whether this is due to C5 cervical nerve root impingement or possibly both. Symptoms progressive. Likely left C5 radiculopathy.   MRI of the cervical spine in 2014 report (do not have images to review) showed focal degenerative disc changes at C3-C4 and C4-C5 causing foraminal stenosis most notable at C4-C5 where left uncovertebral spurring and probable foraminal disc herniation resulting in moderate to advanced left foraminal stenosis. (possible left C5 nerve root impingement)   Summary  Nerve conduction studies were performed on the bilateral upper extremities:  The bilateral Median motor nerves showed normal conductions with normal F Wave latencies The bilateral Ulnar motor nerves showed normal conductions with normal F Wave latencies The bilateral second-digit Median sensory nerves were within normal limits The bilateral fifth-digit Ulnar sensory nerves were within normal limits The  bilateral Radial sensory nerves were within normal limits  EMG Needle study was performed on selected left upper extremity muscles:   The left Deltoid showed increased spontaneous activity (+3 positive sharp waves and fibrillation potentials). The left Supraspinatus, left Infraspinatus, left Biceps, left Triceps, left Pronator Teres, left Opponens Pollicis, left First Dorsal interosseous muscles were within normal limits. Could not evaluate paraspinals due to excessive muscle artifact.   Conclusion: There is acute/ongoing denervation in the left Deltoid, a primarily C5-innervated muscle. Attempted but could not evaluate paraspinals due to excessive muscle artifact. 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

## 2016-08-15 ENCOUNTER — Ambulatory Visit (INDEPENDENT_AMBULATORY_CARE_PROVIDER_SITE_OTHER): Payer: Self-pay

## 2016-08-15 ENCOUNTER — Other Ambulatory Visit: Payer: Self-pay | Admitting: Family Medicine

## 2016-08-15 DIAGNOSIS — M50121 Cervical disc disorder at C4-C5 level with radiculopathy: Secondary | ICD-10-CM

## 2016-08-15 DIAGNOSIS — M79602 Pain in left arm: Secondary | ICD-10-CM

## 2016-08-15 DIAGNOSIS — R202 Paresthesia of skin: Secondary | ICD-10-CM

## 2016-08-15 DIAGNOSIS — I10 Essential (primary) hypertension: Secondary | ICD-10-CM

## 2016-08-15 DIAGNOSIS — R29898 Other symptoms and signs involving the musculoskeletal system: Secondary | ICD-10-CM

## 2016-08-15 DIAGNOSIS — Z0289 Encounter for other administrative examinations: Secondary | ICD-10-CM

## 2016-08-15 DIAGNOSIS — R531 Weakness: Secondary | ICD-10-CM

## 2016-08-15 DIAGNOSIS — M542 Cervicalgia: Secondary | ICD-10-CM

## 2016-08-26 ENCOUNTER — Other Ambulatory Visit: Payer: Self-pay | Admitting: Neurology

## 2016-08-26 ENCOUNTER — Telehealth: Payer: Self-pay | Admitting: Neurology

## 2016-08-26 DIAGNOSIS — M50121 Cervical disc disorder at C4-C5 level with radiculopathy: Secondary | ICD-10-CM

## 2016-08-26 NOTE — Telephone Encounter (Signed)
Douglas Webb, I placed a referral for [facility]alt[facility] for Left C5 radiculopathy. I would like  imaging and Dr. Alfredo BattyMattern or Dr. Wyvonnia DuskySjogry.  Can you make sure they get my last note as well as my emg/ncs report please? Can you ask if they need a copy of his MRI cervical spine disk, I don't think they have access to them thanks

## 2016-08-27 ENCOUNTER — Telehealth: Payer: Self-pay | Admitting: Neurology

## 2016-08-27 NOTE — Telephone Encounter (Signed)
Please see note below per [facility] patient did not have MRI because patient was claustrophobic.

## 2016-08-27 NOTE — Telephone Encounter (Signed)
Why did we call [facility]? Come se eme thanks

## 2016-08-27 NOTE — Telephone Encounter (Signed)
Noted Annabelle HarmanDana spoke to Dr. Lucia GaskinsAhern has MRI disc. Dr. Trevor MaceAhern's order has been sent.

## 2016-08-27 NOTE — Telephone Encounter (Signed)
Faxed NCV/ EMG to [facility] along with order and last note. I will send MRI Disc and MRI when MRI is released. [phone].

## 2016-08-27 NOTE — Telephone Encounter (Signed)
Molly @ [facility] is calling regarding the patient. She does not show the patient having an MRI completed. She says it was cancelled because he was claustrophobiic.

## 2016-09-05 ENCOUNTER — Telehealth: Payer: Self-pay | Admitting: Neurology

## 2016-09-05 DIAGNOSIS — M50121 Cervical disc disorder at C4-C5 level with radiculopathy: Secondary | ICD-10-CM

## 2016-09-05 NOTE — Telephone Encounter (Signed)
Jennifer/GI pt's ins denied his injection

## 2016-09-05 NOTE — Telephone Encounter (Signed)
Called and LVM for Jenel LucksRoberta [phone]) in spine services requesting call back. Dr Lucia GaskinsAhern would like ESI, not facet injections.  Gave [facility] phone number.   Tried Programmer, applicationscalling Jennifer at [phone]. Phone continued to ring. Unable to LVM.

## 2016-09-05 NOTE — Telephone Encounter (Signed)
[facility].

## 2016-09-05 NOTE — Telephone Encounter (Signed)
Tried Programmer, applicationscalling Jennifer at provided number again. Got busy signal.  Called main number for [facility]. Spoke to [place]. She stated Victorino DikeJennifer already gone for the day. She gave message to [facility] that I called again and requesting call before 5pm today.

## 2016-09-05 NOTE — Telephone Encounter (Signed)
Dr Lucia GaskinsAhern- how would you like to proceed? I placed info on denial with your notes. Thank you  Took call from phone staff [place] [facility]. She stated patient is scheduled on 09/13/16 for facet injections. Insurance denied injections stating he did not meet the criteria per their policy. I asked Victorino DikeJennifer to send copy of denial so Dr Lucia GaskinsAhern can review. She is going to fax copy to [phone]. She is requesting how Dr Lucia GaskinsAhern would like to proceed. Advised I will speak to Dr Lucia GaskinsAhern and we will let them know. She verbalized understanding.  Received fax [facility] [place] [facility] detailing reason for denial from [place]. Gave to Dr Lucia GaskinsAhern.

## 2016-09-05 NOTE — Telephone Encounter (Signed)
Called and spoke to [place] [facility] and advised facet injection order a mistake. We are going to place order for ESI instead. She is going to check order in morning when she gets there. She changed appt to match this.   She transferred me to [employer]ennifer inbox. LVM relaying message and to make sure insurance is verified for ESI instead. Gave [facility] phone number if she has further questions.

## 2016-09-05 NOTE — Telephone Encounter (Signed)
Dr Lucia GaskinsAhern- Lorain ChildesFYI. This is taken care of, thank you.

## 2016-09-13 ENCOUNTER — Other Ambulatory Visit: Payer: Managed Care, Other (non HMO)

## 2016-10-03 ENCOUNTER — Other Ambulatory Visit: Payer: Self-pay | Admitting: Family Medicine

## 2016-10-03 DIAGNOSIS — I10 Essential (primary) hypertension: Secondary | ICD-10-CM

## 2016-11-02 ENCOUNTER — Other Ambulatory Visit: Payer: Self-pay | Admitting: Family Medicine

## 2016-11-02 DIAGNOSIS — I10 Essential (primary) hypertension: Secondary | ICD-10-CM

## 2016-11-21 NOTE — Progress Notes (Signed)
HPI:  Douglas Webb is a pleasant 45 yo with a PMH significant for hypertension, hypertriglyceridemia, Obesity and GERD here for follow up. It is good to see him today as he has not been here in almost 1 year. Reports he unfortunately ran out of blood pressure meds a few days ago, has not been exercising and has eaten poorly with resulting weight gain. He wants to get back on track. No CP, SOB, DOE, HA. Since his last visit here her has been seeing Dr. Lucia GaskinsAhern, neurology for a cervical radiculopathy. He now is getting massage 2x weekly and symptoms have resolved! Due for labs, refills and flu shot. Refused flu shot. Running late today and he prefers to come back for labs.  ROS: See pertinent positives and negatives per HPI.  Past Medical History:  Diagnosis Date  . Hypertension   . Kidney stone     Past Surgical History:  Procedure Laterality Date  . ANKLE FRACTURE SURGERY Bilateral   . TONSILLECTOMY      Family History  Problem Relation Age of Onset  . Hypertension Father   . Cancer Father     lung cancer, smoker    Social History   Social History  . Marital status: Married    Spouse name: N/A  . Number of children: N/A  . Years of education: N/A   Occupational History  . manager    Social History Main Topics  . Smoking status: Never Smoker  . Smokeless tobacco: Never Used  . Alcohol use 0.0 oz/week     Comment: occ rare alcohol use  . Drug use: No  . Sexual activity: Not Asked   Other Topics Concern  . None   Social History Narrative   Work or School: Horticulturist, [employer], Programmer, [employer] [place]      Home Situation: wife, son, 2 step kids      Spiritual Beliefs: none, catholic      Lifestyle: 3 miles per day of exercise; healthy diet           Current Outpatient Prescriptions:  .  fexofenadine (ALLEGRA) 180 MG tablet, Take 180 mg by mouth daily., Disp: , Rfl:  .  fluticasone (FLONASE) 50 MCG/ACT nasal spray, Place into both nostrils daily., Disp: ,  Rfl:  .  lisinopril-hydrochlorothiazide (PRINZIDE,ZESTORETIC) 10-12.5 MG tablet, Take 1 tablet by mouth daily., Disp: 90 tablet, Rfl: 1 .  NON FORMULARY, Joice plus once daily, Disp: , Rfl:  .  omeprazole (PRILOSEC) 20 MG capsule, Take 20 mg by mouth daily., Disp: , Rfl:   EXAM:  Vitals:   11/22/16 0936  BP: 138/82  Pulse: 70  Temp: 97.8 F (36.6 C)    Body mass index is 37.35 kg/m.  GENERAL: vitals reviewed and listed above, alert, oriented, appears well hydrated and in no acute distress  HEENT: atraumatic, conjunttiva clear, no obvious abnormalities on inspection of external nose and ears  NECK: no obvious masses on inspection  LUNGS: clear to auscultation bilaterally, no wheezes, rales or rhonchi, good air movement  CV: HRRR, no peripheral edema  MS: moves all extremities without noticeable abnormality  PSYCH: pleasant and cooperative, no obvious depression or anxiety  ASSESSMENT AND PLAN:  Discussed the following assessment and plan:  Essential hypertension - Plan: Basic metabolic panel, CBC (no diff), lisinopril-hydrochlorothiazide (PRINZIDE,ZESTORETIC) 10-12.5 MG tablet  BMI 37.0-37.9, adult  Cervical disc disorder with radiculopathy of cervical region  Hypertriglyceridemia - Plan: Lipid Panel  -refills -lifestyle recs stressed/discussed - see pt instructions for summary -  labs - he prefers to follow up for this as he has a tight schedule today -declined flu shot -Patient advised to return or notify a doctor immediately if symptoms worsen or persist or new concerns arise.  Patient Instructions  BEFORE YOU LEAVE: -schedule fasting lab appointment in the next 4 weeks -follow up: 3-4 months  We have ordered labs or studies at this visit. It can take up to 1-2 weeks for results and processing. IF results require follow up or explanation, we will call you with instructions. Clinically stable results will be released to your [facility]. If you have not heard from us  or cannot find your results in [facility] in 2 weeks please contact our office at [phone].   If you are not yet signed up for [facility], please SIGN UP TODAY. We now offer online scheduling, same day appointments and extended hours. WHEN YOU DON'T FEEL YOUR BEST.Marland Kitchen.Marland Kitchen.WE ARE HERE TO HELP.   We recommend the following healthy lifestyle for LIFE: 1) Small portions.   Tip: eat off of a salad plate instead of a dinner plate.  Tip: It is ok to feel hungry after a meal - that likely means you ate an appropriate portion.  Tip: if you need more or a snack choose fruits, veggies and/or a handful of nuts or seeds.  2) Eat a healthy clean diet.  * Tip: Avoid (less then 1 serving per week): processed foods, sweets, sweetened drinks, white starches (rice, flour, bread, potatoes, pasta, etc), red meat, fast foods, butter  *Tip: CHOOSE instead   * 5-9 servings per day of fresh or frozen fruits and vegetables (but not corn, potatoes, bananas, canned or dried fruit)   *nuts and seeds, beans   *olives and olive oil   *small portions of lean meats such as fish and white chicken    *small portions of whole grains  3)Get at least 150 minutes of sweaty aerobic exercise per week.  4)Reduce stress - consider counseling, meditation and relaxation to balance other aspects of your life.         Kriste BasqueKIM, Illiana Losurdo R., DO

## 2016-11-22 ENCOUNTER — Encounter: Payer: Self-pay | Admitting: Family Medicine

## 2016-11-22 ENCOUNTER — Ambulatory Visit (INDEPENDENT_AMBULATORY_CARE_PROVIDER_SITE_OTHER): Payer: Managed Care, Other (non HMO) | Admitting: Family Medicine

## 2016-11-22 VITALS — BP 138/82 | HR 70 | Temp 97.8°F | Ht 71.0 in | Wt 267.8 lb

## 2016-11-22 DIAGNOSIS — Z6837 Body mass index (BMI) 37.0-37.9, adult: Secondary | ICD-10-CM

## 2016-11-22 DIAGNOSIS — M5010 Cervical disc disorder with radiculopathy, unspecified cervical region: Secondary | ICD-10-CM | POA: Diagnosis not present

## 2016-11-22 DIAGNOSIS — I10 Essential (primary) hypertension: Secondary | ICD-10-CM | POA: Diagnosis not present

## 2016-11-22 DIAGNOSIS — E781 Pure hyperglyceridemia: Secondary | ICD-10-CM | POA: Diagnosis not present

## 2016-11-22 MED ORDER — LISINOPRIL-HYDROCHLOROTHIAZIDE 10-12.5 MG PO TABS: 1.0000 | ORAL_TABLET | Freq: Every day | ORAL | 1 refills | Status: DC

## 2016-11-22 NOTE — Progress Notes (Signed)
Pre visit review using our clinic review tool, if applicable. No additional management support is needed unless otherwise documented below in the visit note. 

## 2016-11-22 NOTE — Patient Instructions (Signed)
BEFORE YOU LEAVE: -schedule fasting lab appointment in the next 4 weeks -follow up: 3-4 months  We have ordered labs or studies at this visit. It can take up to 1-2 weeks for results and processing. IF results require follow up or explanation, we will call you with instructions. Clinically stable results will be released to your [facility]. If you have not heard from us or cannot find your results in [facility] in 2 weeks please contact our office at [phone].   If you are not yet signed up for [facility], please SIGN UP TODAY. We now offer online scheduling, same day appointments and extended hours. WHEN YOU DON'T FEEL YOUR BEST.Marland Kitchen.Marland Kitchen.WE ARE HERE TO HELP.   We recommend the following healthy lifestyle for LIFE: 1) Small portions.   Tip: eat off of a salad plate instead of a dinner plate.  Tip: It is ok to feel hungry after a meal - that likely means you ate an appropriate portion.  Tip: if you need more or a snack choose fruits, veggies and/or a handful of nuts or seeds.  2) Eat a healthy clean diet.  * Tip: Avoid (less then 1 serving per week): processed foods, sweets, sweetened drinks, white starches (rice, flour, bread, potatoes, pasta, etc), red meat, fast foods, butter  *Tip: CHOOSE instead   * 5-9 servings per day of fresh or frozen fruits and vegetables (but not corn, potatoes, bananas, canned or dried fruit)   *nuts and seeds, beans   *olives and olive oil   *small portions of lean meats such as fish and white chicken    *small portions of whole grains  3)Get at least 150 minutes of sweaty aerobic exercise per week.  4)Reduce stress - consider counseling, meditation and relaxation to balance other aspects of your life.

## 2016-12-20 ENCOUNTER — Other Ambulatory Visit: Payer: Managed Care, Other (non HMO)

## 2016-12-25 ENCOUNTER — Other Ambulatory Visit: Payer: Self-pay | Admitting: Neurology

## 2017-03-22 ENCOUNTER — Encounter: Payer: Self-pay | Admitting: Family Medicine

## 2017-03-22 ENCOUNTER — Ambulatory Visit (INDEPENDENT_AMBULATORY_CARE_PROVIDER_SITE_OTHER): Payer: Managed Care, Other (non HMO) | Admitting: Family Medicine

## 2017-03-22 VITALS — BP 120/78 | HR 61 | Temp 97.9°F | Ht 71.0 in | Wt 245.7 lb

## 2017-03-22 DIAGNOSIS — Z6834 Body mass index (BMI) 34.0-34.9, adult: Secondary | ICD-10-CM | POA: Diagnosis not present

## 2017-03-22 DIAGNOSIS — R739 Hyperglycemia, unspecified: Secondary | ICD-10-CM | POA: Diagnosis not present

## 2017-03-22 DIAGNOSIS — K219 Gastro-esophageal reflux disease without esophagitis: Secondary | ICD-10-CM | POA: Diagnosis not present

## 2017-03-22 DIAGNOSIS — I10 Essential (primary) hypertension: Secondary | ICD-10-CM

## 2017-03-22 DIAGNOSIS — E781 Pure hyperglyceridemia: Secondary | ICD-10-CM

## 2017-03-22 LAB — CBC
HCT: 43.2 % (ref 39.0–52.0)
HEMOGLOBIN: 14.7 g/dL (ref 13.0–17.0)
MCHC: 33.9 g/dL (ref 30.0–36.0)
MCV: 86.6 fl (ref 78.0–100.0)
Platelets: 201 10*3/uL (ref 150.0–400.0)
RBC: 4.99 Mil/uL (ref 4.22–5.81)
RDW: 15 % (ref 11.5–15.5)
WBC: 5.9 10*3/uL (ref 4.0–10.5)

## 2017-03-22 LAB — BASIC METABOLIC PANEL
BUN: 17 mg/dL (ref 6–23)
CHLORIDE: 104 meq/L (ref 96–112)
CO2: 29 meq/L (ref 19–32)
CREATININE: 0.88 mg/dL (ref 0.40–1.50)
Calcium: 9.3 mg/dL (ref 8.4–10.5)
GFR: 98.88 mL/min (ref >60.00)
Glucose, Bld: 89 mg/dL (ref 70–99)
POTASSIUM: 4.1 meq/L (ref 3.5–5.1)
Sodium: 140 mEq/L (ref 135–145)

## 2017-03-22 LAB — CHOLESTEROL, TOTAL: CHOLESTEROL: 174 mg/dL (ref 0–200)

## 2017-03-22 LAB — HEMOGLOBIN A1C: Hgb A1c MFr Bld: 5.6 % (ref 4.6–6.5)

## 2017-03-22 LAB — HDL CHOLESTEROL: HDL: 56.9 mg/dL (ref >39.00)

## 2017-03-22 MED ORDER — LISINOPRIL-HYDROCHLOROTHIAZIDE 10-12.5 MG PO TABS: 1.0000 | ORAL_TABLET | Freq: Every day | ORAL | 3 refills | Status: DC

## 2017-03-22 NOTE — Patient Instructions (Addendum)
BEFORE YOU LEAVE: -follow up: in 3-4 months -labs  Try stopping the acid reflux medication if tolerated.  We have ordered labs or studies at this visit. It can take up to 1-2 weeks for results and processing. IF results require follow up or explanation, we will call you with instructions. Clinically stable results will be released to your [facility]. If you have not heard from [place] or cannot find your results in [facility] in 2 weeks please contact our office at [phone].  If you are not yet signed up for [facility], please consider signing up.  Advise regular aerobic exercise (at least 150 minutes per week of sweaty exercise) and a healthy diet. Try to eat at least 5-9 servings of vegetables and fruits per day (not corn, potatoes or bananas.) Avoid sweets, red meat, pork, butter, fried foods, fast food, processed food, excessive dairy, eggs and coconut. Replace bad fats with good fats - fish, nuts and seeds, canola oil, olive oil.   WE NOW OFFER   [facility] FAST TRACK!!!  SAME DAY Appointments for ACUTE CARE  Such as: Sprains, Injuries, cuts, abrasions, rashes, muscle pain, joint pain, back pain Colds, flu, sore throats, headache, allergies, cough, fever  Ear pain, sinus and eye infections Abdominal pain, nausea, vomiting, diarrhea, upset stomach Animal/insect bites  3 Easy Ways to Schedule: Walk-In Scheduling Call in scheduling Mychart Sign-up: [email or web address]

## 2017-03-22 NOTE — Progress Notes (Signed)
HPI:  PMH HTN, HLD, GERD, hyperglycemia, cervical radiculopathy (sees neurologist) and obesity.  Reports doing well. Has been working on diet and exercise since last visit and weight is down and he reports he feels great. Requests refills. Reports no issues with reflux in a long time - continues to take his low dose PPI daily as used to struggle when tried to stop it. Denies CP, reflux, heartburn, swelling. Past due for labs - reports did not make it to lab appointments due to snow.  ROS: See pertinent positives and negatives per HPI.  Past Medical History:  Diagnosis Date  . Hypertension   . Kidney stone     Past Surgical History:  Procedure Laterality Date  . ANKLE FRACTURE SURGERY Bilateral   . TONSILLECTOMY      Family History  Problem Relation Age of Onset  . Hypertension Father   . Cancer Father     lung cancer, smoker    Social History   Social History  . Marital status: Married    Spouse name: N/A  . Number of children: N/A  . Years of education: N/A   Occupational History  . manager    Social History Main Topics  . Smoking status: Never Smoker  . Smokeless tobacco: Never Used  . Alcohol use 0.0 oz/week     Comment: occ rare alcohol use  . Drug use: No  . Sexual activity: Not Asked   Other Topics Concern  . None   Social History Narrative   Work or School: Horticulturist, commercial, Programmer, [employer]      Home Situation: wife, son, 2 step kids      Spiritual Beliefs: none, catholic      Lifestyle: 3 miles per day of exercise; healthy diet           Current Outpatient Prescriptions:  .  fexofenadine (ALLEGRA) 180 MG tablet, Take 180 mg by mouth daily., Disp: , Rfl:  .  fluticasone (FLONASE) 50 MCG/ACT nasal spray, Place into both nostrils daily., Disp: , Rfl:  .  lisinopril-hydrochlorothiazide (PRINZIDE,ZESTORETIC) 10-12.5 MG tablet, Take 1 tablet by mouth daily., Disp: 90 tablet, Rfl: 1 .  NON FORMULARY, Joice plus once daily, Disp: ,  Rfl:  .  omeprazole (PRILOSEC) 20 MG capsule, Take 20 mg by mouth daily., Disp: , Rfl:   EXAM:  Vitals:   03/22/17 0826  BP: 120/78  Pulse: 61  Temp: 97.9 F (36.6 C)    Body mass index is 34.27 kg/m.  GENERAL: vitals reviewed and listed above, alert, oriented, appears well hydrated and in no acute distress  HEENT: atraumatic, conjunttiva clear, no obvious abnormalities on inspection of external nose and ears  NECK: no obvious masses on inspection  LUNGS: clear to auscultation bilaterally, no wheezes, rales or rhonchi, good air movement  CV: HRRR, no peripheral edema  MS: moves all extremities without noticeable abnormality  PSYCH: pleasant and cooperative, no obvious depression or anxiety  ASSESSMENT AND PLAN:  Discussed the following assessment and plan:  Essential hypertension - Plan: Basic metabolic panel, CBC  Hypertriglyceridemia - Plan: HDL cholesterol, Cholesterol, total  Gastroesophageal reflux disease, esophagitis presence not specified  Hyperglycemia - Plan: Hemoglobin A1c  BMI 34.0-34.9,adult  -doing well, congratulated on changes -labs today - NOT fasting -advised to try trial off PPI - discussed risks/benefits, lifestyle changes for GERD -follow up 3-4 months -Patient advised to return or notify a doctor immediately if symptoms worsen or persist or new concerns arise.  Patient Instructions  BEFORE YOU LEAVE: -follow up: in 3-4 months -labs  We have ordered labs or studies at this visit. It can take up to 1-2 weeks for results and processing. IF results require follow up or explanation, we will call you with instructions. Clinically stable results will be released to your [facility]. If you have not heard from [place] or cannot find your results in [facility] in 2 weeks please contact our office at [phone].  If you are not yet signed up for [facility], please consider signing up.  Advise regular aerobic exercise (at least 150 minutes per week of sweaty  exercise) and a healthy diet. Try to eat at least 5-9 servings of vegetables and fruits per day (not corn, potatoes or bananas.) Avoid sweets, red meat, pork, butter, fried foods, fast food, processed food, excessive dairy, eggs and coconut. Replace bad fats with good fats - fish, nuts and seeds, canola oil, olive oil.   WE NOW OFFER   [facility] FAST TRACK!!!  SAME DAY Appointments for ACUTE CARE  Such as: Sprains, Injuries, cuts, abrasions, rashes, muscle pain, joint pain, back pain Colds, flu, sore throats, headache, allergies, cough, fever  Ear pain, sinus and eye infections Abdominal pain, nausea, vomiting, diarrhea, upset stomach Animal/insect bites  3 Easy Ways to Schedule: Walk-In Scheduling Call in scheduling Mychart Sign-up: [email or web address]                Kriste Basque R., DO

## 2017-03-22 NOTE — Progress Notes (Signed)
Pre visit review using our clinic review tool, if applicable. No additional management support is needed unless otherwise documented below in the visit note. 

## 2017-06-24 ENCOUNTER — Ambulatory Visit: Payer: Managed Care, Other (non HMO) | Admitting: Family Medicine

## 2017-11-04 ENCOUNTER — Other Ambulatory Visit: Payer: Self-pay | Admitting: Neurology

## 2017-11-04 MED ORDER — AMOXICILLIN-POT CLAVULANATE 875-125 MG PO TABS: 1.0000 | ORAL_TABLET | Freq: Two times a day (BID) | ORAL | 0 refills | Status: DC

## 2018-01-16 ENCOUNTER — Other Ambulatory Visit: Payer: Self-pay | Admitting: Neurology

## 2018-01-16 MED ORDER — AMOXICILLIN-POT CLAVULANATE 875-125 MG PO TABS: 1.0000 | ORAL_TABLET | Freq: Two times a day (BID) | ORAL | 0 refills | Status: DC

## 2018-01-17 ENCOUNTER — Other Ambulatory Visit: Payer: Self-pay | Admitting: Neurology

## 2018-01-17 MED ORDER — CEFUROXIME AXETIL 500 MG PO TABS: 500.0000 mg | ORAL_TABLET | Freq: Two times a day (BID) | ORAL | 0 refills | Status: DC

## 2018-03-24 ENCOUNTER — Other Ambulatory Visit: Payer: Self-pay | Admitting: Family Medicine

## 2018-03-24 DIAGNOSIS — I10 Essential (primary) hypertension: Secondary | ICD-10-CM

## 2018-04-27 ENCOUNTER — Other Ambulatory Visit: Payer: Self-pay | Admitting: Family Medicine

## 2018-04-27 DIAGNOSIS — I10 Essential (primary) hypertension: Secondary | ICD-10-CM

## 2018-05-29 ENCOUNTER — Other Ambulatory Visit: Payer: Self-pay | Admitting: Family Medicine

## 2018-05-29 DIAGNOSIS — I10 Essential (primary) hypertension: Secondary | ICD-10-CM

## 2018-06-07 ENCOUNTER — Other Ambulatory Visit: Payer: Self-pay | Admitting: Family Medicine

## 2018-06-07 DIAGNOSIS — I10 Essential (primary) hypertension: Secondary | ICD-10-CM

## 2018-06-16 ENCOUNTER — Other Ambulatory Visit: Payer: Self-pay | Admitting: Family Medicine

## 2018-06-16 ENCOUNTER — Encounter: Payer: Self-pay | Admitting: Family Medicine

## 2018-06-16 ENCOUNTER — Other Ambulatory Visit: Payer: Self-pay | Admitting: *Deleted

## 2018-06-16 DIAGNOSIS — I10 Essential (primary) hypertension: Secondary | ICD-10-CM

## 2018-06-16 MED ORDER — LISINOPRIL-HYDROCHLOROTHIAZIDE 10-12.5 MG PO TABS: 1.0000 | ORAL_TABLET | Freq: Every day | ORAL | 0 refills | Status: DC

## 2018-06-16 NOTE — Telephone Encounter (Signed)
Rx done. 

## 2018-06-22 NOTE — Progress Notes (Signed)
HPI:  Using dictation device. Unfortunately this device frequently misinterprets words/phrases.  Douglas Webb is a pleasant 47 yo. He has not been in for > 1 year for follow up. PMH reviewed and updated below.  Reports his been doing well.  Trying to get some regular exercise and healthy.  He reports he had a flare of gout and was treated by his neurologist with colchicine.  That was the second time he had had a painful swollen toe.  He does drink some beer inject Douglas Webb on a regular basis.  He is here for refills on his blood pressure medication.  Denies chest pain, shortness of breath, swelling or any symptoms with exercise.  HTN/HLD/Hyperglycemia/obesity: -lisinopril- hctz  GERD: -meds: low dose PPI  Cervical radiculopathy: -sees neurologist for this, Dr. Lucia GaskinsAhern  ROS: See pertinent positives and negatives per HPI.  Past Medical History:  Diagnosis Date  . Hypertension   . Kidney stone     Past Surgical History:  Procedure Laterality Date  . ANKLE FRACTURE SURGERY Bilateral   . TONSILLECTOMY      Family History  Problem Relation Age of Onset  . Hypertension Father   . Cancer Father        lung cancer, smoker    SOCIAL HX: See HPI, drinks alcohol   Current Outpatient Medications:  .  fexofenadine (ALLEGRA) 180 MG tablet, Take 180 mg by mouth daily., Disp: , Rfl:  .  fluticasone (FLONASE) 50 MCG/ACT nasal spray, Place into both nostrils daily., Disp: , Rfl:  .  lisinopril-hydrochlorothiazide (PRINZIDE,ZESTORETIC) 10-12.5 MG tablet, Take 1 tablet by mouth daily., Disp: 30 tablet, Rfl: 0 .  NON FORMULARY, Joice plus once daily, Disp: , Rfl:   EXAM:  Vitals:   06/23/18 0840  BP: 122/82  Pulse: 67  Temp: 97.9 F (36.6 C)    Body mass index is 34.46 kg/m.  GENERAL: vitals reviewed and listed above, alert, oriented, appears well hydrated and in no acute distress  HEENT: atraumatic, conjunttiva clear, no obvious abnormalities on inspection of external nose  and ears  NECK: no obvious masses on inspection  LUNGS: clear to auscultation bilaterally, no wheezes, rales or rhonchi, good air movement  CV: HRRR, no peripheral edema  MS: moves all extremities without noticeable abnormality  PSYCH: pleasant and cooperative, no obvious depression or anxiety  ASSESSMENT AND PLAN:  Discussed the following assessment and plan:  Essential hypertension  Hyperglycemia  Hypertriglyceridemia  Gout involving toe, unspecified cause, unspecified chronicity, unspecified laterality  Obesity (BMI 30.0-34.9)  -Labs today, will add a uric acid and discussed stopping diuretic and/or adding allopurinol if uric acid elevated -Advised a healthy diet and avoidance of alcohol -See lifestyle recommendations and patient handout -Advised labs, he was running late for haircut today, but agrees to come back for fasting lab visit, orders placed -Refills on medication sent to pharmacy -follow-up 3 to 4 months -Patient advised to return or notify a doctor sooner if new concerns arise.  Patient Instructions  BEFORE YOU LEAVE: -follow up:  1) fasting lab visit in next few weeks 2) follow up in 3-4 months  We have ordered labs or studies at this visit. It can take up to 1-2 weeks for results and processing. IF results require follow up or explanation, we will call you with instructions. Clinically stable results will be [facility] to [facility]. If you have not heard from us or cannot find your results in [facility] in 2 weeks please contact our office at [phone].  If you are not yet signed up for [facility], please consider signing up.    We recommend the following healthy lifestyle for LIFE: 1) Small portions. But, make sure to get regular (at least 3 per day), healthy meals and small healthy snacks if needed.  2) Eat a healthy clean diet.   TRY TO EAT: -at least 5-7 servings of low sugar, colorful, and nutrient rich vegetables per day (not corn, potatoes or  bananas.) -berries are the best choice if you wish to eat fruit (only eat small amounts if trying to reduce weight)  -lean meets (fish, white meat of chicken or [place]) -vegan proteins for some meals - beans or tofu, whole grains, nuts and seeds -Replace bad fats with good fats - good fats include: fish, nuts and seeds, canola oil, olive oil -small amounts of low fat or non fat dairy -small amounts of100 % whole grains - check the lables -drink plenty of water  AVOID: -SUGAR, sweets, anything with added sugar, corn syrup or sweeteners - must read labels as even foods advertised as "healthy" often are loaded with sugar -if you must have a sweetener, small amounts of stevia may be best -sweetened beverages and artificially sweetened beverages -simple starches (rice, bread, potatoes, pasta, chips, etc - small amounts of 100% whole grains are ok) -red meat, pork, butter -fried foods, fast food, processed food, excessive dairy, eggs and coconut.  3)Get at least 150 minutes of sweaty aerobic exercise per week.  4)Reduce stress - consider counseling, meditation and relaxation to balance other aspects of your life.         Terressa Koyanagi, DO

## 2018-06-23 ENCOUNTER — Encounter: Payer: Self-pay | Admitting: Family Medicine

## 2018-06-23 ENCOUNTER — Ambulatory Visit: Payer: BLUE CROSS/BLUE SHIELD | Admitting: Family Medicine

## 2018-06-23 VITALS — BP 122/82 | HR 67 | Temp 97.9°F | Ht 71.0 in | Wt 247.1 lb

## 2018-06-23 DIAGNOSIS — M109 Gout, unspecified: Secondary | ICD-10-CM | POA: Diagnosis not present

## 2018-06-23 DIAGNOSIS — R739 Hyperglycemia, unspecified: Secondary | ICD-10-CM

## 2018-06-23 DIAGNOSIS — E781 Pure hyperglyceridemia: Secondary | ICD-10-CM | POA: Diagnosis not present

## 2018-06-23 DIAGNOSIS — I10 Essential (primary) hypertension: Secondary | ICD-10-CM | POA: Diagnosis not present

## 2018-06-23 DIAGNOSIS — E669 Obesity, unspecified: Secondary | ICD-10-CM | POA: Diagnosis not present

## 2018-06-23 MED ORDER — LISINOPRIL-HYDROCHLOROTHIAZIDE 10-12.5 MG PO TABS: 1.0000 | ORAL_TABLET | Freq: Every day | ORAL | 1 refills | Status: DC

## 2018-06-23 NOTE — Patient Instructions (Signed)
BEFORE YOU LEAVE: -follow up:  1) fasting lab visit in next few weeks 2) follow up in 3-4 months  We have ordered labs or studies at this visit. It can take up to 1-2 weeks for results and processing. IF results require follow up or explanation, we will call you with instructions. Clinically stable results will be released to your [facility]. If you have not heard from us or cannot find your results in [facility] in 2 weeks please contact our office at [phone].  If you are not yet signed up for [facility], please consider signing up.    We recommend the following healthy lifestyle for LIFE: 1) Small portions. But, make sure to get regular (at least 3 per day), healthy meals and small healthy snacks if needed.  2) Eat a healthy clean diet.   TRY TO EAT: -at least 5-7 servings of low sugar, colorful, and nutrient rich vegetables per day (not corn, potatoes or bananas.) -berries are the best choice if you wish to eat fruit (only eat small amounts if trying to reduce weight)  -lean meets (fish, white meat of chicken or [place]) -vegan proteins for some meals - beans or tofu, whole grains, nuts and seeds -Replace bad fats with good fats - good fats include: fish, nuts and seeds, canola oil, olive oil -small amounts of low fat or non fat dairy -small amounts of100 % whole grains - check the lables -drink plenty of water  AVOID: -SUGAR, sweets, anything with added sugar, corn syrup or sweeteners - must read labels as even foods advertised as "healthy" often are loaded with sugar -if you must have a sweetener, small amounts of stevia may be best -sweetened beverages and artificially sweetened beverages -simple starches (rice, bread, potatoes, pasta, chips, etc - small amounts of 100% whole grains are ok) -red meat, pork, butter -fried foods, fast food, processed food, excessive dairy, eggs and coconut.  3)Get at least 150 minutes of sweaty aerobic exercise per week.  4)Reduce stress - consider  counseling, meditation and relaxation to balance other aspects of your life.

## 2018-07-15 ENCOUNTER — Other Ambulatory Visit: Payer: Self-pay | Admitting: Neurology

## 2018-07-15 MED ORDER — AMOXICILLIN-POT CLAVULANATE 875-125 MG PO TABS: 1.0000 | ORAL_TABLET | Freq: Two times a day (BID) | ORAL | 0 refills | Status: DC

## 2018-07-19 ENCOUNTER — Other Ambulatory Visit: Payer: Self-pay | Admitting: Family Medicine

## 2018-07-19 DIAGNOSIS — I10 Essential (primary) hypertension: Secondary | ICD-10-CM

## 2019-03-05 ENCOUNTER — Other Ambulatory Visit: Payer: Self-pay | Admitting: *Deleted

## 2019-03-05 DIAGNOSIS — I10 Essential (primary) hypertension: Secondary | ICD-10-CM

## 2019-03-05 MED ORDER — LISINOPRIL-HYDROCHLOROTHIAZIDE 10-12.5 MG PO TABS: 1.0000 | ORAL_TABLET | Freq: Every day | ORAL | 0 refills | Status: DC

## 2019-03-10 ENCOUNTER — Other Ambulatory Visit: Payer: Self-pay | Admitting: Neurology

## 2019-03-10 MED ORDER — AZELASTINE HCL 0.1 % NA SOLN: 1.0000 | Freq: Two times a day (BID) | NASAL | 12 refills | Status: AC

## 2019-06-17 ENCOUNTER — Other Ambulatory Visit: Payer: Self-pay | Admitting: Neurology

## 2019-06-17 MED ORDER — AMOXICILLIN-POT CLAVULANATE 875-125 MG PO TABS: 1.0000 | ORAL_TABLET | Freq: Two times a day (BID) | ORAL | 0 refills | Status: DC

## 2019-06-24 ENCOUNTER — Other Ambulatory Visit: Payer: Self-pay | Admitting: Family Medicine

## 2019-06-24 DIAGNOSIS — I10 Essential (primary) hypertension: Secondary | ICD-10-CM

## 2019-08-17 ENCOUNTER — Other Ambulatory Visit: Payer: Self-pay | Admitting: Family Medicine

## 2019-08-17 DIAGNOSIS — I10 Essential (primary) hypertension: Secondary | ICD-10-CM

## 2019-08-17 NOTE — Addendum Note (Signed)
Addended by: Jefferson Fuel on: 08/17/2019 10:47 AM   Modules accepted: Orders

## 2019-08-17 NOTE — Telephone Encounter (Signed)
Copied from [place] [id number]. Topic: Quick Communication - Rx Refill/Question >> Aug 17, 2019 10:19 AM Rainey Pines A wrote: Medication: lisinopril-hydrochlorothiazide (ZESTORETIC) 10-12.5 MG tablet (Patient stated that pharmacy does not have mediication in stock and patient needs an alternative medciation sent over and would like a callback once medication has been sent over.)  Has the patient contacted their pharmacy? {Yes (Agent: If no, request that the patient contact the pharmacy for the refill.) (Agent: If yes, when and what did the pharmacy advise?)Contact PCP  Preferred Pharmacy (with phone number or street name): [id number] - [place], [place] - [address] [place] [address] [place] [address] [place] [address] [phone] (Phone) [phone] (Fax)    Agent: Please be advised that RX refills may take up to 3 business days. We ask that you follow-up with your pharmacy.

## 2019-08-17 NOTE — Telephone Encounter (Signed)
Requested medication (s) are due for refill today: yes  Requested medication (s) are on the active medication list: yes  Last refill: 06/30/2019  Future visit scheduled: no  Notes to clinic: Patient stated that pharmacy does not have mediication in stock and patient needs an alternative medciation sent over and would like a callback once medication has been sent over.)    Requested Prescriptions  Pending Prescriptions Disp Refills   lisinopril-hydrochlorothiazide (ZESTORETIC) 10-12.5 MG tablet 30 tablet 0    Sig: Take 1 tablet by mouth daily.     Cardiovascular:  ACEI + Diuretic Combos Failed - 08/17/2019 10:47 AM      Failed - Na in normal range and within 180 days    Sodium  Date Value Ref Range Status  03/22/2017 140 135 - 145 mEq/L Final         Failed - K in normal range and within 180 days    Potassium  Date Value Ref Range Status  03/22/2017 4.1 3.5 - 5.1 mEq/L Final         Failed - Cr in normal range and within 180 days    Creatinine, Ser  Date Value Ref Range Status  03/22/2017 0.88 0.40 - 1.50 mg/dL Final         Failed - Ca in normal range and within 180 days    Calcium  Date Value Ref Range Status  03/22/2017 9.3 8.4 - 10.5 mg/dL Final         Failed - Valid encounter within last 6 months    Recent Outpatient Visits          1 year ago Essential hypertension   Therapist, [facility] at [employer], [place], DO   2 years ago Essential hypertension   Therapist, [facility] [employer], [place], DO   2 years ago Essential hypertension   Therapist, music at [employer], [place], [place]   3 years ago [place] [facility], [place], [place]   3 years ago Essential hypertension   Therapist, [facility] at [employer], [place], [place] - Patient is not pregnant      Passed - Last BP in normal range    BP Readings from Last 1 Encounters:  06/23/18 122/82

## 2019-08-18 MED ORDER — LISINOPRIL-HYDROCHLOROTHIAZIDE 10-12.5 MG PO TABS: 1.0000 | ORAL_TABLET | Freq: Every day | ORAL | 0 refills | Status: DC

## 2019-09-14 ENCOUNTER — Other Ambulatory Visit: Payer: Self-pay | Admitting: Family Medicine

## 2019-09-14 DIAGNOSIS — I10 Essential (primary) hypertension: Secondary | ICD-10-CM

## 2019-10-12 ENCOUNTER — Other Ambulatory Visit: Payer: Self-pay | Admitting: Family Medicine

## 2019-10-12 DIAGNOSIS — I10 Essential (primary) hypertension: Secondary | ICD-10-CM

## 2019-11-09 ENCOUNTER — Other Ambulatory Visit: Payer: Self-pay | Admitting: Family Medicine

## 2019-11-09 DIAGNOSIS — I10 Essential (primary) hypertension: Secondary | ICD-10-CM

## 2019-12-14 ENCOUNTER — Other Ambulatory Visit: Payer: Self-pay | Admitting: Neurology

## 2019-12-14 ENCOUNTER — Other Ambulatory Visit (INDEPENDENT_AMBULATORY_CARE_PROVIDER_SITE_OTHER): Payer: Self-pay

## 2019-12-14 DIAGNOSIS — Z20822 Contact with and (suspected) exposure to covid-19: Secondary | ICD-10-CM

## 2019-12-14 DIAGNOSIS — R7309 Other abnormal glucose: Secondary | ICD-10-CM

## 2019-12-14 DIAGNOSIS — R635 Abnormal weight gain: Secondary | ICD-10-CM

## 2019-12-14 DIAGNOSIS — Z1152 Encounter for screening for COVID-19: Secondary | ICD-10-CM

## 2019-12-14 DIAGNOSIS — Z01812 Encounter for preprocedural laboratory examination: Secondary | ICD-10-CM

## 2019-12-14 DIAGNOSIS — Z0289 Encounter for other administrative examinations: Secondary | ICD-10-CM

## 2019-12-14 DIAGNOSIS — E559 Vitamin D deficiency, unspecified: Secondary | ICD-10-CM

## 2019-12-14 DIAGNOSIS — I10 Essential (primary) hypertension: Secondary | ICD-10-CM

## 2019-12-14 MED ORDER — LISINOPRIL-HYDROCHLOROTHIAZIDE 10-12.5 MG PO TABS: 1.0000 | ORAL_TABLET | Freq: Every day | ORAL | 3 refills | Status: DC

## 2019-12-14 NOTE — Progress Notes (Signed)
vi 

## 2019-12-15 ENCOUNTER — Other Ambulatory Visit: Payer: Self-pay | Admitting: Neurology

## 2019-12-15 DIAGNOSIS — E559 Vitamin D deficiency, unspecified: Secondary | ICD-10-CM

## 2019-12-15 LAB — COMPREHENSIVE METABOLIC PANEL
ALT: 19 IU/L (ref 0–44)
AST: 19 IU/L (ref 0–40)
Albumin/Globulin Ratio: 1.4 (ref 1.2–2.2)
Albumin: 4.4 g/dL (ref 4.0–5.0)
Alkaline Phosphatase: 77 IU/L (ref 39–117)
BUN/Creatinine Ratio: 16 (ref 9–20)
BUN: 15 mg/dL (ref 6–24)
Bilirubin Total: 0.4 mg/dL (ref 0.0–1.2)
CO2: 25 mmol/L (ref 20–29)
Calcium: 9.1 mg/dL (ref 8.7–10.2)
Chloride: 102 mmol/L (ref 96–106)
Creatinine, Ser: 0.93 mg/dL (ref 0.76–1.27)
GFR calc Af Amer: 111 mL/min/{1.73_m2} (ref >59)
GFR calc non Af Amer: 96 mL/min/{1.73_m2} (ref >59)
Globulin, Total: 3.1 g/dL (ref 1.5–4.5)
Glucose: 92 mg/dL (ref 65–99)
Potassium: 4.5 mmol/L (ref 3.5–5.2)
Sodium: 139 mmol/L (ref 134–144)
Total Protein: 7.5 g/dL (ref 6.0–8.5)

## 2019-12-15 LAB — LIPID PANEL
Chol/HDL Ratio: 3.3 ratio (ref 0.0–5.0)
Cholesterol, Total: 193 mg/dL (ref 100–199)
HDL: 59 mg/dL (ref >39)
LDL Chol Calc (NIH): 110 mg/dL — ABNORMAL HIGH (ref 0–99)
Triglycerides: 136 mg/dL (ref 0–149)
VLDL Cholesterol Cal: 24 mg/dL (ref 5–40)

## 2019-12-15 LAB — TSH: TSH: 1.76 u[IU]/mL (ref 0.450–4.500)

## 2019-12-15 LAB — CBC
Hematocrit: 45.7 % (ref 37.5–51.0)
Hemoglobin: 15.1 g/dL (ref 13.0–17.7)
MCH: 28.5 pg (ref 26.6–33.0)
MCHC: 33 g/dL (ref 31.5–35.7)
MCV: 86 fL (ref 79–97)
Platelets: 234 10*3/uL (ref 150–450)
RBC: 5.29 x10E6/uL (ref 4.14–5.80)
RDW: 13.9 % (ref 11.6–15.4)
WBC: 7 10*3/uL (ref 3.4–10.8)

## 2019-12-15 LAB — SAR COV2 SEROLOGY (COVID19)AB(IGG),IA: DiaSorin SARS-CoV-2 Ab, IgG: NEGATIVE

## 2019-12-15 LAB — HEMOGLOBIN A1C
Est. average glucose Bld gHb Est-mCnc: 111 mg/dL
Hgb A1c MFr Bld: 5.5 % (ref 4.8–5.6)

## 2019-12-15 LAB — VITAMIN D 25 HYDROXY (VIT D DEFICIENCY, FRACTURES): Vit D, 25-Hydroxy: 13.9 ng/mL — ABNORMAL LOW (ref 30.0–100.0)

## 2019-12-15 MED ORDER — VITAMIN D (ERGOCALCIFEROL) 1.25 MG (50000 UNIT) PO CAPS: 50000.0000 [IU] | ORAL_CAPSULE | ORAL | 3 refills | Status: AC

## 2020-05-11 ENCOUNTER — Other Ambulatory Visit: Payer: Self-pay | Admitting: Neurology

## 2020-05-11 DIAGNOSIS — E559 Vitamin D deficiency, unspecified: Secondary | ICD-10-CM

## 2020-08-11 ENCOUNTER — Other Ambulatory Visit (INDEPENDENT_AMBULATORY_CARE_PROVIDER_SITE_OTHER): Payer: Self-pay

## 2020-08-11 ENCOUNTER — Other Ambulatory Visit: Payer: Self-pay

## 2020-08-11 ENCOUNTER — Other Ambulatory Visit: Payer: Self-pay | Admitting: Neurology

## 2020-08-11 ENCOUNTER — Ambulatory Visit
Admission: RE | Admit: 2020-08-11 | Discharge: 2020-08-11 | Disposition: A | Discharge status: Home / Self Care | Payer: BLUE CROSS/BLUE SHIELD | Source: Ambulatory Visit | Attending: Neurology | Admitting: Neurology

## 2020-08-11 DIAGNOSIS — M10071 Idiopathic gout, right ankle and foot: Secondary | ICD-10-CM

## 2020-08-11 DIAGNOSIS — Z0289 Encounter for other administrative examinations: Secondary | ICD-10-CM

## 2020-08-11 DIAGNOSIS — M79674 Pain in right toe(s): Secondary | ICD-10-CM

## 2020-08-12 LAB — COMPREHENSIVE METABOLIC PANEL
ALT: 25 IU/L (ref 0–44)
AST: 23 IU/L (ref 0–40)
Albumin/Globulin Ratio: 1.4 (ref 1.2–2.2)
Albumin: 4.3 g/dL (ref 4.0–5.0)
Alkaline Phosphatase: 87 IU/L (ref 48–121)
BUN/Creatinine Ratio: 13 (ref 9–20)
BUN: 12 mg/dL (ref 6–24)
Bilirubin Total: 0.4 mg/dL (ref 0.0–1.2)
CO2: 25 mmol/L (ref 20–29)
Calcium: 8.9 mg/dL (ref 8.7–10.2)
Chloride: 101 mmol/L (ref 96–106)
Creatinine, Ser: 0.9 mg/dL (ref 0.76–1.27)
GFR calc Af Amer: 116 mL/min/{1.73_m2} (ref >59)
GFR calc non Af Amer: 100 mL/min/{1.73_m2} (ref >59)
Globulin, Total: 3 g/dL (ref 1.5–4.5)
Glucose: 96 mg/dL (ref 65–99)
Potassium: 4.2 mmol/L (ref 3.5–5.2)
Sodium: 138 mmol/L (ref 134–144)
Total Protein: 7.3 g/dL (ref 6.0–8.5)

## 2020-08-12 LAB — CBC WITH DIFFERENTIAL/PLATELET
Basophils Absolute: 0 10*3/uL (ref 0.0–0.2)
Basos: 1 %
EOS (ABSOLUTE): 0.2 10*3/uL (ref 0.0–0.4)
Eos: 2 %
Hematocrit: 41.7 % (ref 37.5–51.0)
Hemoglobin: 14.1 g/dL (ref 13.0–17.7)
Immature Grans (Abs): 0 10*3/uL (ref 0.0–0.1)
Immature Granulocytes: 1 %
Lymphocytes Absolute: 2 10*3/uL (ref 0.7–3.1)
Lymphs: 26 %
MCH: 29 pg (ref 26.6–33.0)
MCHC: 33.8 g/dL (ref 31.5–35.7)
MCV: 86 fL (ref 79–97)
Monocytes Absolute: 0.7 10*3/uL (ref 0.1–0.9)
Monocytes: 9 %
Neutrophils Absolute: 4.9 10*3/uL (ref 1.4–7.0)
Neutrophils: 61 %
Platelets: 231 10*3/uL (ref 150–450)
RBC: 4.87 x10E6/uL (ref 4.14–5.80)
RDW: 13.6 % (ref 11.6–15.4)
WBC: 7.8 10*3/uL (ref 3.4–10.8)

## 2020-08-12 LAB — VITAMIN D PNL(25-HYDRXY+1,25-DIHY)-BLD
Vit D, 1,25-Dihydroxy: 68.4 pg/mL (ref 19.9–79.3)
Vit D, 25-Hydroxy: 22.4 ng/mL — ABNORMAL LOW (ref 30.0–100.0)

## 2020-08-12 LAB — URIC ACID: Uric Acid: 8.1 mg/dL (ref 3.8–8.4)

## 2020-08-12 LAB — C-REACTIVE PROTEIN: CRP: 7 mg/L (ref 0–10)

## 2020-08-12 LAB — SEDIMENTATION RATE: Sed Rate: 19 mm/hr — ABNORMAL HIGH (ref 0–15)

## 2020-09-06 ENCOUNTER — Other Ambulatory Visit: Payer: Self-pay | Admitting: Neurology

## 2020-09-06 MED ORDER — ALLOPURINOL 100 MG PO TABS: 100.0000 mg | ORAL_TABLET | Freq: Every day | ORAL | 6 refills | Status: DC

## 2020-09-06 MED ORDER — COLCHICINE 0.6 MG PO TABS: ORAL_TABLET | ORAL | 6 refills | Status: DC

## 2020-10-14 ENCOUNTER — Other Ambulatory Visit: Payer: Self-pay | Admitting: Neurology

## 2020-10-14 MED ORDER — AMOXICILLIN-POT CLAVULANATE 875-125 MG PO TABS
1.0000 | ORAL_TABLET | Freq: Two times a day (BID) | ORAL | 0 refills | Status: DC
Start: 2020-10-14 — End: 2021-05-04

## 2020-10-31 ENCOUNTER — Other Ambulatory Visit: Payer: Self-pay | Admitting: Neurology

## 2020-10-31 MED ORDER — ALLOPURINOL 300 MG PO TABS
300.0000 mg | ORAL_TABLET | Freq: Every day | ORAL | 4 refills | Status: DC
Start: 2020-10-31 — End: 2021-12-14

## 2020-10-31 MED ORDER — ALLOPURINOL 100 MG PO TABS
300.0000 mg | ORAL_TABLET | Freq: Every day | ORAL | 3 refills | Status: DC
Start: 2020-10-31 — End: 2020-10-31

## 2020-11-16 LAB — EXTERNAL GENERIC LAB PROCEDURE: COLOGUARD: NEGATIVE

## 2021-04-13 ENCOUNTER — Telehealth (INDEPENDENT_AMBULATORY_CARE_PROVIDER_SITE_OTHER): Payer: Managed Care, Other (non HMO) | Admitting: Neurology

## 2021-04-13 DIAGNOSIS — R29898 Other symptoms and signs involving the musculoskeletal system: Secondary | ICD-10-CM | POA: Diagnosis not present

## 2021-04-13 DIAGNOSIS — M5412 Radiculopathy, cervical region: Secondary | ICD-10-CM

## 2021-04-13 DIAGNOSIS — M79602 Pain in left arm: Secondary | ICD-10-CM | POA: Diagnosis not present

## 2021-04-18 ENCOUNTER — Encounter: Payer: Self-pay | Admitting: Neurology

## 2021-04-18 NOTE — Patient Instructions (Signed)
Cervical Radiculopathy  Cervical radiculopathy happens when a nerve in the neck (a cervical nerve) is pinched or bruised. This condition can happen because of an injury to the cervical spine (vertebrae) in the neck, or as part of the normal aging process. Pressure on the cervical nerves can cause pain or numbness that travels from the neck all the way down into the arm and fingers. Usually, this condition gets better with rest. Treatment may be needed if the condition does not improve. What are the causes? This condition may be caused by:  A neck injury.  A bulging (herniated) disk.  Muscle spasms.  Muscle tightness in the neck because of overuse.  Arthritis.  Breakdown or degeneration in the bones and joints of the spine (spondylosis) due to aging.  Bone spurs that may develop near the cervical nerves. What are the signs or symptoms? Symptoms of this condition include:  Pain. The pain may travel from the neck to the arm and hand. The pain can be severe or irritating. It may be worse when you move your neck.  Numbness or tingling in your arm or hand.  Weakness in the affected arm and hand, in severe cases. How is this diagnosed? This condition may be diagnosed based on your symptoms, your medical history, and a physical exam. You may also have tests, including:  X-rays.  A CT scan.  An MRI.  An electromyogram (EMG).  Nerve conduction tests. How is this treated? In many cases, treatment is not needed for this condition. With rest, the condition usually gets better over time. If treatment is needed, options may include:  Wearing a soft neck collar (cervical collar) for short periods of time, as told by your health care provider.  Doing physical therapy to strengthen your neck muscles.  Taking medicines, such as NSAIDs or oral corticosteroids.  Having spinal injections, in severe cases.  Having surgery. This may be needed if other treatments do not help. Different types  of surgery may be done depending on the cause of this condition. Follow these instructions at home: If you have a cervical collar:  Wear it as told by your health care provider. Remove it only as told by your health care provider.  Ask your health care provider if you can remove the collar for cleaning and bathing. If you are allowed to remove the collar for cleaning or bathing: ? Follow instructions from your health care provider about how to remove the collar safely. ? Clean the collar by wiping it with mild soap and water and drying it completely. ? Take out any removable pads in the collar every 1-2 days, and wash them by hand with soap and water. Let them air-dry completely before you put them back in the collar. ? Check your skin under the collar for irritation or sores. If you see any, tell your health care provider. Managing pain  Take over-the-counter and prescription medicines only as told by your health care provider.  If directed, put ice on the affected area. ? If you have a soft neck collar, remove it as told by your health care provider. ? Put ice in a plastic bag. ? Place a towel between your skin and the bag. ? Leave the ice on for 20 minutes, 2-3 times a day.  If applying ice does not help, you can try using heat. Use the heat source that your health care provider recommends, such as a moist heat pack or a heating pad. ? Place a towel   between your skin and the heat source. ? Leave the heat on for 20-30 minutes. ? Remove the heat if your skin turns bright red. This is especially important if you are unable to feel pain, heat, or cold. You may have a greater risk of getting burned.  Try a gentle neck and shoulder massage to help relieve symptoms.      Activity  Rest as needed.  Return to your normal activities as told by your health care provider. Ask your health care provider what activities are safe for you.  Do stretching and strengthening exercises as told by  your health care provider or physical therapist.  Do not lift anything that is heavier than 10 lb (4.5 kg) until your health care provider tells you that it is safe. General instructions  Use a flat pillow when you sleep.  Do not drive while wearing a cervical collar. If you do not have a cervical collar, ask your health care provider if it is safe to drive while your neck heals.  Ask your health care provider if the medicine prescribed to you requires you to avoid driving or using heavy machinery.  Do not use any products that contain nicotine or tobacco, such as cigarettes, e-cigarettes, and chewing tobacco. These can delay healing. If you need help quitting, ask your health care provider.  Keep all follow-up visits as told by your health care provider. This is important. Contact a health care provider if:  Your condition does not improve with treatment. Get help right away if:  Your pain gets much worse and cannot be controlled with medicines.  You have weakness or numbness in your hand, arm, face, or leg.  You have a high fever.  You have a stiff, rigid neck.  You lose control of your bowels or your bladder (have incontinence).  You have trouble with walking, balance, or speaking. Summary  Cervical radiculopathy happens when a nerve in the neck is pinched or bruised.  A nerve can get pinched from a bulging disk, arthritis, muscle spasms, or an injury to the neck.  Symptoms include pain, tingling, or numbness radiating from the neck into the arm or hand. Weakness can also occur in severe cases.  Treatment may include rest, wearing a cervical collar, and physical therapy. Medicines may be prescribed to help with pain. In severe cases, injections or surgery may be needed. This information is not intended to replace advice given to you by your health care provider. Make sure you discuss any questions you have with your health care provider. Document Revised: 10/10/2018  Document Reviewed: 10/10/2018 Elsevier Patient Education  2021 [employer]  

## 2021-04-18 NOTE — Progress Notes (Signed)
[facility]    Provider:  Dr Lucia Gaskins Referring Provider: No ref. provider found, Dr. Thereasa Distance chiropractor Primary Care Physician:  Pcp, No  CC:  Arm and neck pain  Virtual Visit via Video Note  I connected with Davinder Haff [facility] on 04/18/21 at  3:00 PM EDT by a video enabled telemedicine application and verified that I am speaking with the correct person using two identifiers.  Location: Patient: home Provider: office   I discussed the limitations of evaluation and management by telemedicine and the availability of in person appointments. The patient expressed understanding and agreed to proceed.  History of Present Illness: This is a patient I have seen in the past for follow-up of left arm pain and weakness with radiating pain and burning into the proximal arm and shoulder, at the time that I saw him in 2017 symptoms had been persistent and worsening for 3 to 4 years, he had to been to physical therapy with minimal relief, Lyrica did not help ibuprofen did not help and it was significantly impacting his life.  MRI was consistent with his symptoms and clinical exam showing impingement at C5 on the left as well as EMG and nerve conduction findings.  We tried to reimage patient and he was claustrophobic.  Since then patient has been keeping active, he has been to a chiropractor, he stretches, he exercises regularly, he is a Corporate treasurer, he has tried and failed multiple medications including Lyrica, ibuprofen, Tylenol, gabapentin, Flexeril, muscle relaxers, Cymbalta, nortriptyline and the tricyclic antidepressants, patient feels medications have not helped and the only thing that really improves his quality of life is therapy and activity.  Over the summer he can exercise but it is more difficult over the winter.  We discussed multiple options.    Follow Up Instructions:    I discussed the assessment and treatment plan with the patient. The patient was provided an  opportunity to ask questions and all were answered. The patient agreed with the plan and demonstrated an understanding of the instructions.   The patient was advised to call back or seek an in-person evaluation if the symptoms worsen or if the condition fails to improve as anticipated.  I provided 30 minutes of non-face-to-face time during this encounter.    Anson Fret, MD    HPI:  Douglas Webb is a 50 y.o. male here as a referral from Dr. No ref. provider found and Dr. Thereasa Distance for left arm pain. Left arm weakness has been chronic for the last 3-4 years. He endorses weakness proximally and pain with burning radiating in the proximal left arm, rotator cuff and cervical musculature. Weakness mostly  on left arm abduction and actions requiring external rotation. Started 3-4 years ago and progressive. He was working out a lot at the time of the onset of weakness and driving long trips with his left arm and sleeping on the left side which worsened his weakness and pain. He had physical therapy and weakness minimally improved. He was supposed to have an emg/ncs but never had it. He feels radiation and burning into the left paraspinal cervical muscles, shoulder, proximal arm and rotator cuff. The weakness is mostly in the lifting the arm laterally, worse with driving. Worse with use especially on ipad he feels weak using a mouse. Better with rest. He feels weak in the left proximal muscles, he denies any distal weakness or weakness in grip strength or dropping objects from his hand.  His arm  gets tired frequently. resting it and keeping it at his side helps. Lyrica did not help in the past and ibuprofen does not help either. Feels tired and weak. Marland Kitchen. He feels much better with stretching and manipualtion of his neck especially neck traction. He has associated muscle spasms in the deltoid and biceps. [facility] his symptoms 3-4 years ago and had an MRI of the cervical  spine and an MRI of his shoulder completed 3-4 years ago with results below. Arm symptoms significantly impairing his ability to drive and also use arm on the computer. Progressive and worsening. No other associated symptoms, focal neurologic deficits or complaints. No changes in bowel and bladder, no retention or incontinence of bowel or bladder. No gait abnormalities.  Reviewed notes, labs and imaging from outside physicians, which showed:   BMP in January 2016 was essentially normal with normal creatinine 0.91, LDL 68, hemoglobin A1c 5.7  MRI of the cervical spine in 2014 report (do not have images to review) showed focal degenerative disc changes at C3-C4 and C4-C5 causing foraminal stenosis most notable at C4-C5 where left uncovertebral spurring and probable foraminal disc herniation resulting in moderate to advanced left foraminal stenosis. (possible left C5 nerve root impingement)  MRI of the shoulder including the rotator cuff cuff muscles significant for: Supraspinatus tendinopathy, subscapularis tendon fraying of the articular surface and the critical zone, edema in the supraspinatus and infraspinatus suggesting prior strain.  Review of Systems: Patient complains of symptoms per HPI as well as the following symptoms: no CP, no SOB. Pertinent negatives per HPI. All others negative.   Social History   Socioeconomic History  . Marital status: Married    Spouse name: Not on file  . Number of children: Not on file  . Years of education: Not on file  . Highest education level: Not on file  Occupational History  . Occupation: Production designer, theatre/television/filmmanager  Tobacco Use  . Smoking status: Never Smoker  . Smokeless tobacco: Never Used  Substance and Sexual Activity  . Alcohol use: Yes    Alcohol/week: 0.0 standard drinks    Comment: occ rare alcohol use  . Drug use: No  . Sexual activity: Not on file  Other Topics Concern  . Not on file  Social History Narrative   Work or School: Horticulturist, [employer] [place]      Home Situation: wife, son, 2 step kids      Spiritual Beliefs: none, catholic      Lifestyle: 3 miles per day of exercise; healthy diet      Social Determinants of Health   Financial Resource Strain: Not on file  Food Insecurity: Not on file  Transportation Needs: Not on file  Physical Activity: Not on file  Stress: Not on file  Social Connections: Not on file  Intimate Partner Violence: Not on file    Family History  Problem Relation Age of Onset  . Hypertension Father   . Cancer Father        lung cancer, smoker    Past Medical History:  Diagnosis Date  . Hypertension   . Kidney stone     Past Surgical History:  Procedure Laterality Date  . ANKLE FRACTURE SURGERY Bilateral   . TONSILLECTOMY      Current Outpatient Medications  Medication Sig Dispense Refill  . allopurinol (ZYLOPRIM) 300 MG tablet Take 1 tablet (300 mg total) by mouth daily. For gout prevention. 90 tablet 4  . amoxicillin-clavulanate (  AUGMENTIN) 875-125 MG tablet Take 1 tablet by mouth 2 (two) times daily. 14 tablet 0  . azelastine (ASTELIN) 0.1 % nasal spray Place 1 spray into both nostrils 2 (two) times daily. Use in each nostril as directed 30 mL 12  . colchicine 0.6 MG tablet For Gout attack take two tabs (1.2mg ) immediately. Then may take one additional tablet one hour later.  Maximum 3 tablets the first day. For subsequent days take 1-2 tablets a day as needed until acute attack has resolved. May take with Allopurinol. 30 tablet 6  . fexofenadine (ALLEGRA) 180 MG tablet Take 180 mg by mouth daily.    . fluticasone (FLONASE) 50 MCG/ACT nasal spray Place into both nostrils daily.    Marland Kitchen lisinopril-hydrochlorothiazide (ZESTORETIC) 10-12.5 MG tablet Take 1 tablet by mouth daily. 90 tablet 3  . NON FORMULARY Joice plus once daily    . Vitamin D, Ergocalciferol, (DRISDOL) 1.25 MG (50000 UNIT) CAPS capsule Take 1 capsule (50,000 Units total) by mouth every 7 (seven) days.  5 capsule 3   No current facility-administered medications for this visit.    Allergies as of 04/13/2021  . (No Known Allergies)    Vitals: There were no vitals taken for this visit. Last Weight:  Wt Readings from Last 1 Encounters:  06/23/18 247 lb 1.6 oz (112.1 kg)   Last Height:   Ht Readings from Last 1 Encounters:  06/23/18 5\' 11"  (1.803 m)    Patient is in no acute distress, he is conversant, well nourished, obese, conjunctivae clear without exudates or hemorrhage, speech is normal fluent and spontaneous, cognition intact, pupils equally round reactive to light, face symmetric, tongue midline, shoulder shrug appears intact, voice is normal, hearing normal, patient has pain on palpation of the left deltoid, rotator cuff muscles and trapezius, normal range of motion of the shoulders, no fasciculations noted, possible atrophy of the rotator cuff muscles, no dysmetria appreciated coordination appears intact.  Difficult to assess strength over video however patient still appears to have weakness of the left rotator cuff musculature as well as left deltoid.     PRIOR Strength: Patient has weakness of the left rotator cuff musculature as well as left deltoid. Left deltoid 4+ out of 5, infraspinatus and supraspinatus weakness 4/5. Otherwise strength is V/V in the upper and lower limbs.        Assessment/Plan:  50 year old male with chronic left arm weakness with radiating pain and burning into the proximal arm and shoulder. Symptoms persistent 8+ years. He has been to physical therapy with minimal relief, multiple medications did not help.  MRI and emg/ncs was consistent with his symptoms and clinical exam showing impingement at C5 on the left. We tried to reimage patient and he was claustrophobic.  Since then patient has been keeping active, he has been to a chiropractor, he stretches, he exercises regularly, he is a lacrosse coach,patient feels medications have not helped and the only  thing that really improves his quality of life is mild activity, but has to be careful to exercise as tolerated.  Patient has tried multiple medications, physical therapy and chiropractic, medications gave him side effects.  He did mention that swimming has helped the best, I suggested hydrotherapy and heat therapy which will probably be the best solution for patient to keep him active and possibly avoid surgery.  I did suggest this to him and show him some examples of hydrotherapy units that he can buy, these are spots that include a hot tub on  1 side for heat therapy and on the other side a current that  you can swim against, these can be placed in relatively small space like in a backyard or even in the basement, this could be a great solution and I suggested he look into buying 1 of these units.  He has had COVID in the past, and given his risk factors including obesity and hypertension I do recommend exercising at home if possible instead of going to a gym every day.  And water therapy is easy on the joints, can provide improved joint movement and range of motion, improve mobility and balance, reduce pain, build muscle strength and improve circulation.  And also importantly patient would like to avoid surgery.    Prior assessment and plan:    Significantly impacting his life including difficulty driving and using the arm on the computer. Previous imaging showed moderate to severe foraminal stenosis and possible impingement at C5 on the left and corresponds to root-level innervation of the proximal left arm muscles and rotator cuff muscles which are weak on clinical exam. Also seen was mild edema in the rotator cuff muscles and tendinopathy of the rotator cuff muscles. Unclear etiology of his pain and weakness whether he continues to have tendinopathy and edema of the rotator cuff muscles or whether this is due to C5 cervical nerve root impingement or possibly both. Symptoms progressive. We'll reimage  MRI of the cervical spine and rotator cuff as last imaging was 3-4 years ago. We'll also order an EMG nerve conduction study of the bilateral upper extremities including ulnar, median motor  studies, ulnar and median and radial sensory studies and see if we can get an axillary sensory conduction. as well as EMG of the left arm, rotator cuff musculature and cervical paraspinals. We'll request CDs with imaging of his previous MRI of the cervical spine and left shoulder in 2014.   Likely left C5 radiculopathy.     Naomie Dean, MD  [facility] [address] [place], [place] [address]  Phone [phone] Fax [phone]

## 2021-05-04 ENCOUNTER — Other Ambulatory Visit: Payer: Self-pay | Admitting: Neurology

## 2021-05-04 MED ORDER — AMOXICILLIN-POT CLAVULANATE 875-125 MG PO TABS
1.0000 | ORAL_TABLET | Freq: Two times a day (BID) | ORAL | 0 refills | Status: AC
Start: 2021-05-04 — End: ?

## 2021-09-17 ENCOUNTER — Other Ambulatory Visit: Payer: Self-pay | Admitting: Neurology

## 2021-09-17 MED ORDER — MOUNJARO 5 MG/0.5ML ~~LOC~~ SOAJ
5.0000 mg | SUBCUTANEOUS | 6 refills | Status: DC
Start: 2021-09-17 — End: 2021-12-13

## 2021-10-24 ENCOUNTER — Other Ambulatory Visit: Payer: Self-pay | Admitting: Neurology

## 2021-12-12 ENCOUNTER — Other Ambulatory Visit: Payer: Self-pay | Admitting: Neurology

## 2021-12-13 ENCOUNTER — Telehealth: Payer: Self-pay | Admitting: *Deleted

## 2021-12-13 ENCOUNTER — Telehealth (INDEPENDENT_AMBULATORY_CARE_PROVIDER_SITE_OTHER): Payer: No Typology Code available for payment source | Admitting: Neurology

## 2021-12-13 ENCOUNTER — Other Ambulatory Visit (HOSPITAL_COMMUNITY): Payer: Self-pay

## 2021-12-13 DIAGNOSIS — E1169 Type 2 diabetes mellitus with other specified complication: Secondary | ICD-10-CM

## 2021-12-13 DIAGNOSIS — E119 Type 2 diabetes mellitus without complications: Secondary | ICD-10-CM | POA: Insufficient documentation

## 2021-12-13 MED ORDER — MOUNJARO 5 MG/0.5ML ~~LOC~~ SOAJ
5.0000 mg | SUBCUTANEOUS | 6 refills | Status: DC
  Filled 2021-12-13 – 2021-12-15 (×3): qty 2, 28d supply, fill #0

## 2021-12-13 NOTE — Progress Notes (Addendum)
 [facility]    Provider:  Dr Lucia Gaskins Referring Provider: No ref. provider found, Dr. Thereasa Distance chiropractor Primary Care Physician:  Pcp, No  CC:  Arm and neck pain  February 17, 2024: Patient has been on [facility] now 15 mg for at least 2 years and doing incredibly well.  He has maintained his hemoglobin A1c and he has lost weight we will send in a refill.  Addendum 06/10/2022: This is a 51 year old patient relatively healthy who  has type 2 diabetes hemoglobin A1c per records and patient report since COVID and his weight gain a hemoglobin A1c of over 7.  Patient has gained 40 pounds during COVID, he was treated in the past for his cervical radiculopathy, he is asking for my help today he has been on Mounjaro has been on it for 4 months and is doing excellent at losing weight and getting his hemoglobin A1c down, but he recently changed insurances and it is not being covered he needs a prior authorization. I do think that her diabetes and increased weight are playing a role in his cervical radiculopathy which has improved since managing his hemoglobin A1c and weight.  In the past he has used Trulicity, victoza, mounjaro(woring great), glipizide, metformin, Glimepiride, invokana, jardiance, januvia, Pioglitazone, victoza, trulicity, Ozempic and Saxenda with nausea and he has also tried metformin with diarrhea.  I will try and help him with a prior authorization. Victoza with hypoglycemia, rybelsus again with nausea and constipation. He has been on [facility] for 11 months now and doing well stoppin monjaro may cause withdrawal and sie effects and rebound hyoerglycemia.  Virtual Visit via Video Note  I connected with Douglas Webb on 12/13/21 at  4:00 PM EST by a video enabled telemedicine application and verified that I am speaking with the correct person using two identifiers.  Location: Patient: home Provider: office   I discussed the limitations of evaluation and management  by telemedicine and the availability of in person appointments. The patient expressed understanding and agreed to proceed.  Follow Up Instructions:    I discussed the assessment and treatment plan with the patient. The patient was provided an opportunity to ask questions and all were answered. The patient agreed with the plan and demonstrated an understanding of the instructions.   The patient was advised to call back or seek an in-person evaluation if the symptoms worsen or if the condition fails to improve as anticipated.  I provided 50 minutes of non-face-to-face time during this encounter.   Anson Fret, MD  12/13/2021: This is a 51 year old patient relatively healthy who  has type 2 diabetes hemoglobin A1c per records and patient report since COVID and his weight gain a hemoglobin A1c of over 7.  Patient has gained 40 pounds during COVID, he was treated in the past for his cervical radiculopathy, he is asking for my help today he has been on Mounjaro has been on it for 4 months and is doing excellent at losing weight and getting his hemoglobin A1c down, but he recently changed insurances and it is not being covered he needs a prior authorization. I do think that her diabetes and increased weight are playing a role in his cervical radiculopathy which has improved since managing his hemoglobin A1c and weight.  In the past he has used Trulicity, victoza, mounjaro(woring great), glipizide, metformin, Glimepiride, invokana, jardiance, januvia, Pioglitazone, victoza, trulicity, Ozempic and Saxenda with nausea and he has also tried metformin with diarrhea.  I will try  and help him with a prior authorization. Victoza with hypoglycemia, rybelsus again with nausea and constipation. He has been on [facility] for 11 months now and doing well stoppin monjaro may cause withdrawal and sie effects and rebound hyoerglycemia.  History of Present Illness: This is a patient I have seen in the past for follow-up  of left arm pain and weakness with radiating pain and burning into the proximal arm and shoulder, at the time that I saw him in 2017 symptoms had been persistent and worsening for 3 to 4 years, he had to been to physical therapy with minimal relief, Lyrica did not help ibuprofen did not help and it was significantly impacting his life.  MRI was consistent with his symptoms and clinical exam showing impingement at C5 on the left as well as EMG and nerve conduction findings.  We tried to reimage patient and he was claustrophobic.  Since then patient has been keeping active, he has been to a chiropractor, he stretches, he exercises regularly, he is a Corporate treasurer, he has tried and failed multiple medications including Lyrica, ibuprofen, Tylenol, gabapentin, Flexeril, muscle relaxers, Cymbalta, nortriptyline and the tricyclic antidepressants, patient feels medications have not helped and the only thing that really improves his quality of life is therapy and activity.  Over the summer he can exercise but it is more difficult over the winter.  We discussed multiple options.    Follow Up Instructions:    I discussed the assessment and treatment plan with the patient. The patient was provided an opportunity to ask questions and all were answered. The patient agreed with the plan and demonstrated an understanding of the instructions.   The patient was advised to call back or seek an in-person evaluation if the symptoms worsen or if the condition fails to improve as anticipated.  I provided 30 minutes of non-face-to-face time during this encounter.    Anson Fret, MD    HPI:  Douglas Webb is a 51 y.o. male here as a referral from Dr. No ref. provider found and Dr. Thereasa Distance for left arm pain. Left arm weakness has been chronic for the last 3-4 years. He endorses weakness proximally and pain with burning radiating in the proximal left arm, rotator cuff and cervical musculature. Weakness mostly   on left arm abduction and actions requiring external rotation. Started 3-4 years ago and progressive. He was working out a lot at the time of the onset of weakness and driving long trips with his left arm and sleeping on the left side which worsened his weakness and pain. He had physical therapy and weakness minimally improved. He was supposed to have an emg/ncs but never had it. He feels radiation and burning into the left paraspinal cervical muscles, shoulder, proximal arm and rotator cuff. The weakness is mostly in the lifting the arm laterally, worse with driving. Worse with use especially on ipad he feels weak using a mouse. Better with rest. He feels weak in the left proximal muscles, he denies any distal weakness or weakness in grip strength or dropping objects from his hand.  His arm gets tired frequently. resting it and keeping it at his side helps. Lyrica did not help in the past and ibuprofen does not help either. Feels tired and weak. Marland Kitchen He feels much better with stretching and manipualtion of his neck especially neck traction. He has associated muscle spasms in the deltoid and biceps. He was seen by [facility] for his symptoms 3-4 years  ago and had an MRI of the cervical spine and an MRI of his shoulder completed 3-4 years ago with results below. Arm symptoms significantly impairing his ability to drive and also use arm on the computer. Progressive and worsening. No other associated symptoms, focal neurologic deficits or complaints. No changes in bowel and bladder, no retention or incontinence of bowel or bladder. No gait abnormalities.  Reviewed notes, labs and imaging from outside physicians, which showed:   BMP in January 2016 was essentially normal with normal creatinine 0.91, LDL 68, hemoglobin A1c 5.7  MRI of the cervical spine in 2014 report (do not have images to review) showed focal degenerative disc changes at C3-C4 and C4-C5 causing foraminal stenosis most notable at C4-C5  where left uncovertebral spurring and probable foraminal disc herniation resulting in moderate to advanced left foraminal stenosis. (possible left C5 nerve root impingement)  MRI of the shoulder including the rotator cuff cuff muscles significant for: Supraspinatus tendinopathy, subscapularis tendon fraying of the articular surface and the critical zone, edema in the supraspinatus and infraspinatus suggesting prior strain.  Review of Systems: Patient complains of symptoms per HPI as well as the following symptoms: no CP, no SOB. Pertinent negatives per HPI. All others negative.   Social History   Socioeconomic History   Marital status: Married    Spouse name: Not on file   Number of children: Not on file   Years of education: Not on file   Highest education level: Not on file  Occupational History   Occupation: Production designer, theatre/television/film  Tobacco Use   Smoking status: Never   Smokeless tobacco: Never  Substance and Sexual Activity   Alcohol use: Yes    Alcohol/week: 0.0 standard drinks    Comment: occ rare alcohol use   Drug use: No   Sexual activity: Not on file  Other Topics Concern   Not on file  Social History Narrative   Work or School: Horticulturist, commercial, Programmer, [employer]      Home Situation: wife, son, 2 step kids      Spiritual Beliefs: none, catholic      Lifestyle: 3 miles per day of exercise; healthy diet      Social Determinants of Health   Financial Resource Strain: Not on file  Food Insecurity: Not on file  Transportation Needs: Not on file  Physical Activity: Not on file  Stress: Not on file  Social Connections: Not on file  Intimate Partner Violence: Not on file    Family History  Problem Relation Age of Onset   Hypertension Father    Cancer Father        lung cancer, smoker    Past Medical History:  Diagnosis Date   Hypertension    Kidney stone     Past Surgical History:  Procedure Laterality Date   ANKLE FRACTURE SURGERY Bilateral     TONSILLECTOMY      Current Outpatient Medications  Medication Sig Dispense Refill   allopurinol (ZYLOPRIM) 300 MG tablet Take 1 tablet (300 mg total) by mouth daily. For gout prevention. 90 tablet 4   amoxicillin-clavulanate (AUGMENTIN) 875-125 MG tablet Take 1 tablet by mouth 2 (two) times daily. 20 tablet 0   azelastine (ASTELIN) 0.1 % nasal spray Place 1 spray into both nostrils 2 (two) times daily. Use in each nostril as directed 30 mL 12   colchicine 0.6 MG tablet TAKE 2 TABLETS FOR GOUT ATTACKS IMMEDIATELY THEN TAKE 1 TABLET 1 HOURS LATER, NO MORE THAN 3  TABLET MAXIMUM THE FIRST DAY. 1-2 TABLET AS NEEDED 30 tablet 6   fexofenadine (ALLEGRA) 180 MG tablet Take 180 mg by mouth daily.     fluticasone (FLONASE) 50 MCG/ACT nasal spray Place into both nostrils daily.     lisinopril-hydrochlorothiazide (ZESTORETIC) 10-12.5 MG tablet Take 1 tablet by mouth daily. 90 tablet 3   NON FORMULARY Joice plus once daily     tirzepatide (MOUNJARO) 5 MG/0.5ML Pen Inject 5 mg into the skin once a week. See additional notes to pharmacy 2 mL 6   Vitamin D, Ergocalciferol, (DRISDOL) 1.25 MG (50000 UNIT) CAPS capsule Take 1 capsule (50,000 Units total) by mouth every 7 (seven) days. 5 capsule 3   No current facility-administered medications for this visit.    Allergies as of 12/13/2021   (No Known Allergies)    Vitals: There were no vitals taken for this visit. Last Weight:  Wt Readings from Last 1 Encounters:  06/23/18 247 lb 1.6 oz (112.1 kg)   Last Height:   Ht Readings from Last 1 Encounters:  06/23/18 5\' 11"  (1.803 m)   Weight 265, Height 5'9" 39.1  Physical exam: Exam: Gen: NAD, conversant      CV: attempted, Could not perform over Web Video. Denies palpitations or chest pain or SOB. VS: Breathing at a normal rate. Weight appears obese (last 260). Not febrile. Eyes: Conjunctivae clear without exudates or hemorrhage  Neuro: Detailed Neurologic Exam  Speech:    Speech is normal;  fluent and spontaneous with normal comprehension.  Cognition:    The patient is oriented to person, place, and time;     recent and remote memory intact;     language fluent;     normal attention, concentration,     fund of knowledge Cranial Nerves:    The pupils are equal, round, and reactive to light. Cannot perform fundoscopic exam. Visual fields are full to finger confrontation. Extraocular movements are intact.  The face is symmetric with normal sensation. The palate elevates in the midline. Hearing intact. Voice is normal. Shoulder shrug is normal. The tongue has normal motion without fasciculations.   Coordination:    Normal finger to nose  Gait:    Normal native gait  Motor Observation:   no involuntary movements noted. Tone:    Appears normal  Posture:    Posture is normal. normal erect    Strength:    Strength is anti-gravity and symmetric in the upper and lower limbs.      Sensation: intact to LT      Assessment/Plan:  This is a 51 year old patient relatively healthy who has recent type 2 diabetes hemoglobin A1c per records and patient report since COVID and his weight gain a hemoglobin A1c of 7.  Patient has gained 40 pounds during COVID, he was treated in the past for his cervical radiculopathy, he is asking for my help today he has been on Mounjaro has been on it for several years and is doing excellent at losing weight and getting his hemoglobin A1c stable, but he recently changed insurances and it is not being covered he needs a prior authorization. I do think that his diabetes and increased weight are playing a role in his cervical radiculopathy which has improved since managing his hemoglobin A1c and weight.  In the past he has used Trulicity, [place] with nausea and he has also tried metformin with diarrhea. Victoza with hypoglycemia. I will try and help him with a prior authorization.  February 17, 2024: Patient has been on Mounjaro now 15 mg for at least  2 years and doing incredibly well.  He has maintained his hemoglobin A1c and he has lost weight we will send in a refill.  Addendum 79/2023: This is a 51 year old patient relatively healthy who  has type 2 diabetes hemoglobin A1c per records and patient report since COVID and his weight gain a hemoglobin A1c of over 7.  Patient has gained 40 pounds during COVID, he was treated in the past for his cervical radiculopathy, he is asking for my help today he has been on Mounjaro has been on it for 4 months and is doing excellent at losing weight and getting his hemoglobin A1c down, but he recently changed insurances and it is not being covered he needs a prior authorization. I do think that her diabetes and increased weight are playing a role in his cervical radiculopathy which has improved since managing his hemoglobin A1c and weight.  In the past he has used Trulicity, victoza, mounjaro(woring great), glipizide, metformin, Glimepiride, invokana, jardiance, januvia, Pioglitazone, victoza, trulicity, Ozempic and Saxenda with nausea and he has also tried metformin with diarrhea.  I will try and help him with a prior authorization. Victoza with hypoglycemia, rybelsus again with nausea and constipation. He has been on [facility] for 11 months now and doing well stoppin monjaro may cause withdrawal and sie effects and rebound hyoerglycemia. Hgba1c > 7, is now significantly improved, no side effects to mounjaro, obesity BMI > 35 weight 250, HTN, and HLd.   On mounjaro for 11 months doing well for DM2. new insurances.   In the past he has used Trulicity, victoza, mounjaro(woring great), glipizide, metformin, Glimepiride, invokana, jardiance, januvia, Pioglitazone, victoza, trulicity, Ozempic and Saxenda with nausea and he has also tried metformin with diarrhea.  I will try and help him with a prior authorization. Victoza with hypoglycemia, rybelsus again with nausea and constipation. He has been on [facility] for 11 months now  and doing well stoppin monjaro may cause withdrawal and sie effects and rebound hyoerglycemia.. Let's see if the copay card works: [id number] group [id number] PCN [id number] ID [id number]? Hgba1c > 7, is now significantly improved, no side effects to mounjaro, obesity BMI > 35 weight 250, HTN, and HLd.   Calling pharmacy, spent 20 mins on PA  Naomie Dean, MD  [facility] [address] [place], [place] [address]  Phone [phone] Fax [phone]

## 2021-12-13 NOTE — Telephone Encounter (Signed)
Douglas Webb Douglas Webb - PA Case ID: [id number]  PA for Douglas Webb has been sent this afternoon. Waiting on approval

## 2021-12-15 ENCOUNTER — Other Ambulatory Visit (HOSPITAL_COMMUNITY): Payer: Self-pay

## 2021-12-17 ENCOUNTER — Other Ambulatory Visit: Payer: Self-pay | Admitting: Neurology

## 2021-12-17 DIAGNOSIS — E1169 Type 2 diabetes mellitus with other specified complication: Secondary | ICD-10-CM

## 2021-12-17 MED ORDER — MOUNJARO 5 MG/0.5ML ~~LOC~~ SOAJ
5.0000 mg | SUBCUTANEOUS | 0 refills | Status: DC
  Filled 2021-12-17: qty 2, 28d supply, fill #0

## 2021-12-18 ENCOUNTER — Other Ambulatory Visit: Payer: Self-pay | Admitting: Neurology

## 2021-12-18 ENCOUNTER — Other Ambulatory Visit (HOSPITAL_COMMUNITY): Payer: Self-pay

## 2021-12-18 DIAGNOSIS — E1169 Type 2 diabetes mellitus with other specified complication: Secondary | ICD-10-CM

## 2021-12-18 MED ORDER — MOUNJARO 5 MG/0.5ML ~~LOC~~ SOAJ
5.0000 mg | SUBCUTANEOUS | 0 refills | Status: DC
  Filled 2021-12-18 – 2021-12-19 (×2): qty 2, 28d supply, fill #0

## 2021-12-19 ENCOUNTER — Other Ambulatory Visit (HOSPITAL_COMMUNITY): Payer: Self-pay

## 2021-12-19 NOTE — Telephone Encounter (Signed)
Great, thanks

## 2021-12-19 NOTE — Telephone Encounter (Signed)
PA has been approved. PA# [employer] [place] [id number] (12/19/2021 to 12/19/2022).

## 2022-01-15 ENCOUNTER — Other Ambulatory Visit: Payer: Self-pay | Admitting: Neurology

## 2022-01-15 ENCOUNTER — Other Ambulatory Visit (HOSPITAL_COMMUNITY): Payer: Self-pay

## 2022-01-15 DIAGNOSIS — E1169 Type 2 diabetes mellitus with other specified complication: Secondary | ICD-10-CM

## 2022-01-15 MED ORDER — MOUNJARO 5 MG/0.5ML ~~LOC~~ SOAJ
5.0000 mg | SUBCUTANEOUS | 5 refills | Status: DC
  Filled 2022-01-15: qty 2, 28d supply, fill #0

## 2022-01-29 ENCOUNTER — Other Ambulatory Visit: Payer: Self-pay | Admitting: Neurology

## 2022-01-29 MED ORDER — MOUNJARO 7.5 MG/0.5ML ~~LOC~~ SOAJ
7.5000 mg | SUBCUTANEOUS | 1 refills | Status: DC
  Filled 2022-01-29: qty 6, 84d supply, fill #0
  Filled 2022-04-17: qty 6, 84d supply, fill #1

## 2022-01-29 MED ORDER — MOUNJARO 7.5 MG/0.5ML ~~LOC~~ SOAJ
7.5000 mg | SUBCUTANEOUS | 1 refills | Status: DC
  Filled 2022-01-29: qty 6, 84d supply, fill #0

## 2022-01-30 ENCOUNTER — Other Ambulatory Visit (HOSPITAL_COMMUNITY): Payer: Self-pay

## 2022-04-18 ENCOUNTER — Other Ambulatory Visit (HOSPITAL_COMMUNITY): Payer: Self-pay

## 2022-05-12 ENCOUNTER — Other Ambulatory Visit: Payer: Self-pay | Admitting: Neurology

## 2022-05-12 DIAGNOSIS — I10 Essential (primary) hypertension: Secondary | ICD-10-CM

## 2022-05-12 MED ORDER — LISINOPRIL-HYDROCHLOROTHIAZIDE 10-12.5 MG PO TABS: 1.0000 | ORAL_TABLET | Freq: Every day | ORAL | 3 refills | Status: DC

## 2022-05-24 IMAGING — CR DG TOE GREAT 2+V*R*
3 series · 3 of 3 positions shown · non-contrast
Comparison: None.

CLINICAL DATA: Right toe pain X 1 year but increased within the
past week, arthritis versus gout. No known injury.

EXAM:
RIGHT GREAT TOE

[x toes ap right]
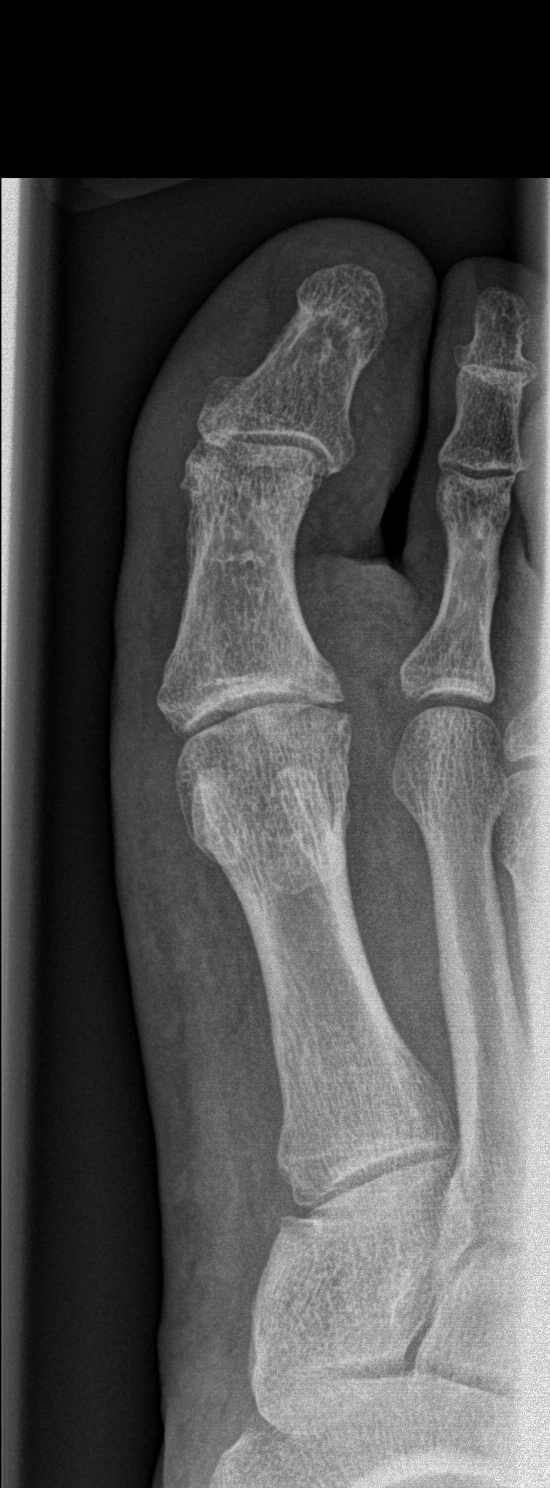

[x toes obl right]
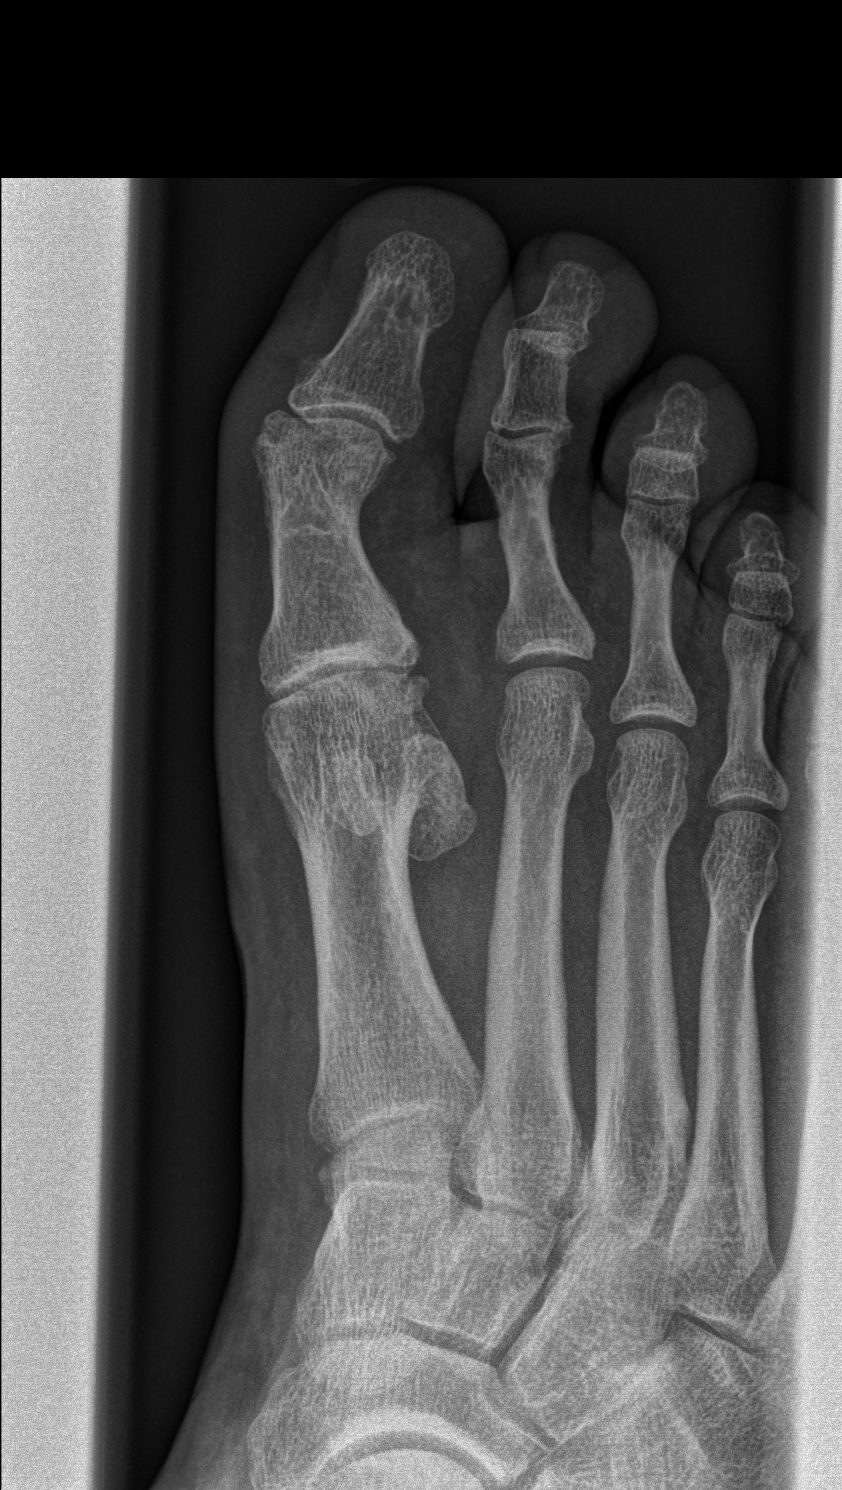

[x toes lat right]
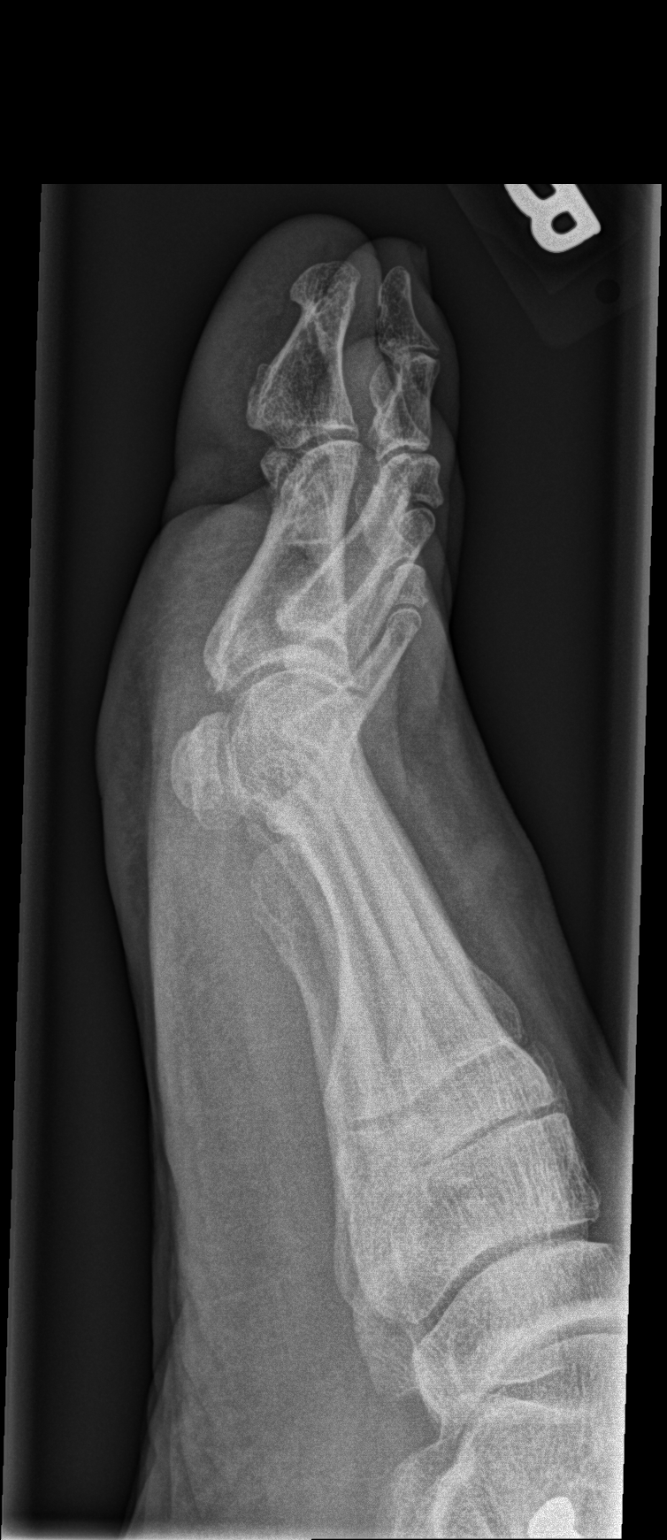

[3 of 3 positions shown; findings below may reference images not displayed]

FINDINGS: There is no evidence of fracture or dislocation. Mild joint space
narrowing at the first metatarsophalangeal joint. No suspicious
focal bone abnormality. Soft tissues are unremarkable.
IMPRESSION: Mild first metatarsophalangeal joint osteoarthritis. No radiographic
findings to suggest gout.

No acute fracture or dislocation of the right first digit.

## 2022-06-10 ENCOUNTER — Other Ambulatory Visit: Payer: Self-pay | Admitting: Neurology

## 2022-06-10 DIAGNOSIS — E1169 Type 2 diabetes mellitus with other specified complication: Secondary | ICD-10-CM

## 2022-06-10 MED ORDER — MOUNJARO 10 MG/0.5ML ~~LOC~~ SOAJ: 10.0000 mg | SUBCUTANEOUS | 4 refills | Status: DC

## 2022-06-11 ENCOUNTER — Other Ambulatory Visit: Payer: Self-pay | Admitting: *Deleted

## 2022-06-11 ENCOUNTER — Other Ambulatory Visit (HOSPITAL_COMMUNITY): Payer: Self-pay

## 2022-06-11 DIAGNOSIS — E1169 Type 2 diabetes mellitus with other specified complication: Secondary | ICD-10-CM

## 2022-06-11 MED ORDER — MOUNJARO 10 MG/0.5ML ~~LOC~~ SOAJ
10.0000 mg | SUBCUTANEOUS | 4 refills | Status: DC
  Filled 2022-06-11: qty 2, 28d supply, fill #0
  Filled 2022-07-08: qty 2, 28d supply, fill #1

## 2022-06-11 NOTE — Progress Notes (Signed)
Mounjaro sent to [facility] pharmacy per v.o. Dr Lucia Gaskins.

## 2022-06-13 ENCOUNTER — Telehealth: Payer: Self-pay

## 2022-06-13 NOTE — Telephone Encounter (Signed)
LVM for call back

## 2022-07-08 ENCOUNTER — Other Ambulatory Visit: Payer: Self-pay | Admitting: Neurology

## 2022-07-08 DIAGNOSIS — I10 Essential (primary) hypertension: Secondary | ICD-10-CM

## 2022-07-08 DIAGNOSIS — E1169 Type 2 diabetes mellitus with other specified complication: Secondary | ICD-10-CM

## 2022-07-08 MED ORDER — MOUNJARO 10 MG/0.5ML ~~LOC~~ SOAJ
10.0000 mg | SUBCUTANEOUS | 4 refills | Status: DC
  Filled 2022-07-11: qty 6, 84d supply, fill #0
  Filled 2022-09-29: qty 6, 84d supply, fill #1

## 2022-07-09 ENCOUNTER — Other Ambulatory Visit (HOSPITAL_COMMUNITY): Payer: Self-pay

## 2022-07-10 ENCOUNTER — Other Ambulatory Visit: Payer: Self-pay | Admitting: Neurology

## 2022-07-10 ENCOUNTER — Other Ambulatory Visit (HOSPITAL_COMMUNITY): Payer: Self-pay

## 2022-07-10 DIAGNOSIS — E1169 Type 2 diabetes mellitus with other specified complication: Secondary | ICD-10-CM

## 2022-07-11 ENCOUNTER — Other Ambulatory Visit (HOSPITAL_COMMUNITY): Payer: Self-pay

## 2022-07-17 ENCOUNTER — Other Ambulatory Visit (HOSPITAL_COMMUNITY): Payer: Self-pay

## 2022-07-19 ENCOUNTER — Other Ambulatory Visit (HOSPITAL_COMMUNITY): Payer: Self-pay

## 2022-07-19 MED ORDER — LISINOPRIL 20 MG PO TABS
20.0000 mg | ORAL_TABLET | Freq: Every day | ORAL | 0 refills | Status: DC
  Filled 2022-07-19: qty 30, 30d supply, fill #0

## 2022-09-11 ENCOUNTER — Other Ambulatory Visit (HOSPITAL_COMMUNITY): Payer: Self-pay

## 2022-09-12 ENCOUNTER — Other Ambulatory Visit (HOSPITAL_COMMUNITY): Payer: Self-pay

## 2022-09-29 ENCOUNTER — Other Ambulatory Visit (HOSPITAL_COMMUNITY): Payer: Self-pay

## 2022-10-01 ENCOUNTER — Other Ambulatory Visit: Payer: Self-pay | Admitting: Neurology

## 2022-10-01 ENCOUNTER — Other Ambulatory Visit (HOSPITAL_COMMUNITY): Payer: Self-pay

## 2022-10-01 MED ORDER — LISINOPRIL 20 MG PO TABS
20.0000 mg | ORAL_TABLET | Freq: Every day | ORAL | 4 refills | Status: DC
  Filled 2022-10-01: qty 90, 90d supply, fill #0
  Filled 2022-12-06 – 2022-12-26 (×2): qty 90, 90d supply, fill #1
  Filled 2023-04-03: qty 90, 90d supply, fill #2
  Filled 2023-07-16: qty 90, 90d supply, fill #3

## 2022-10-02 ENCOUNTER — Other Ambulatory Visit (HOSPITAL_COMMUNITY): Payer: Self-pay

## 2022-10-02 MED ORDER — LISINOPRIL 20 MG PO TABS
20.0000 mg | ORAL_TABLET | Freq: Every day | ORAL | 0 refills | Status: AC
  Filled 2022-10-02 – 2023-01-02 (×2): qty 30, 30d supply, fill #0

## 2022-10-12 ENCOUNTER — Other Ambulatory Visit: Payer: Self-pay | Admitting: Neurology

## 2022-10-12 MED ORDER — MOUNJARO 12.5 MG/0.5ML ~~LOC~~ SOAJ
12.5000 mg | SUBCUTANEOUS | 3 refills | Status: DC
  Filled 2022-10-12: qty 6, 84d supply, fill #0
  Filled 2022-12-06: qty 6, 84d supply, fill #1
  Filled 2022-12-26: qty 2, 28d supply, fill #1
  Filled 2023-01-02: qty 6, 84d supply, fill #1

## 2022-10-15 ENCOUNTER — Other Ambulatory Visit (HOSPITAL_COMMUNITY): Payer: Self-pay

## 2022-12-06 ENCOUNTER — Other Ambulatory Visit (HOSPITAL_COMMUNITY): Payer: Self-pay

## 2022-12-21 ENCOUNTER — Other Ambulatory Visit (HOSPITAL_COMMUNITY): Payer: Self-pay

## 2022-12-26 ENCOUNTER — Other Ambulatory Visit: Payer: Self-pay

## 2022-12-26 ENCOUNTER — Other Ambulatory Visit (HOSPITAL_COMMUNITY): Payer: Self-pay

## 2023-01-01 ENCOUNTER — Other Ambulatory Visit (HOSPITAL_COMMUNITY): Payer: Self-pay

## 2023-01-01 ENCOUNTER — Other Ambulatory Visit: Payer: Self-pay

## 2023-01-02 ENCOUNTER — Other Ambulatory Visit (HOSPITAL_COMMUNITY): Payer: Self-pay

## 2023-01-03 ENCOUNTER — Other Ambulatory Visit (HOSPITAL_COMMUNITY): Payer: Self-pay

## 2023-01-03 ENCOUNTER — Other Ambulatory Visit: Payer: Self-pay | Admitting: Neurology

## 2023-01-03 DIAGNOSIS — E118 Type 2 diabetes mellitus with unspecified complications: Secondary | ICD-10-CM

## 2023-01-03 DIAGNOSIS — E1141 Type 2 diabetes mellitus with diabetic mononeuropathy: Secondary | ICD-10-CM

## 2023-01-03 MED ORDER — ZEPBOUND 12.5 MG/0.5ML ~~LOC~~ SOAJ
12.5000 mg | SUBCUTANEOUS | 4 refills | Status: DC
  Filled 2023-01-03: qty 2, 28d supply, fill #0

## 2023-01-03 MED ORDER — WEGOVY 2.4 MG/0.75ML ~~LOC~~ SOAJ
2.4000 mg | SUBCUTANEOUS | 3 refills | Status: DC
  Filled 2023-01-03: qty 3, 28d supply, fill #0

## 2023-01-03 MED ORDER — MOUNJARO 12.5 MG/0.5ML ~~LOC~~ SOAJ
12.5000 mg | SUBCUTANEOUS | 3 refills | Status: DC
  Filled 2023-01-03: qty 6, 84d supply, fill #0

## 2023-01-03 MED ORDER — MOUNJARO 12.5 MG/0.5ML ~~LOC~~ SOAJ: 12.5000 mg | SUBCUTANEOUS | 3 refills | Status: DC

## 2023-01-03 MED ORDER — WEGOVY 2.4 MG/0.75ML ~~LOC~~ SOAJ
2.4000 mg | SUBCUTANEOUS | 3 refills | Status: DC
  Filled 2023-01-03: qty 9, fill #0

## 2023-01-03 MED ORDER — ZEPBOUND 12.5 MG/0.5ML ~~LOC~~ SOAJ
12.5000 mg | SUBCUTANEOUS | 11 refills | Status: DC
  Filled 2023-01-03: qty 6, 84d supply, fill #0
  Filled 2023-01-07: qty 2, 28d supply, fill #0

## 2023-01-04 ENCOUNTER — Other Ambulatory Visit (HOSPITAL_COMMUNITY): Payer: Self-pay

## 2023-01-07 ENCOUNTER — Other Ambulatory Visit (HOSPITAL_COMMUNITY): Payer: Self-pay

## 2023-01-09 ENCOUNTER — Other Ambulatory Visit: Payer: Self-pay | Admitting: Neurology

## 2023-01-09 ENCOUNTER — Other Ambulatory Visit (HOSPITAL_COMMUNITY): Payer: Self-pay

## 2023-01-09 DIAGNOSIS — E1141 Type 2 diabetes mellitus with diabetic mononeuropathy: Secondary | ICD-10-CM

## 2023-01-09 DIAGNOSIS — I10 Essential (primary) hypertension: Secondary | ICD-10-CM

## 2023-01-09 MED ORDER — MOUNJARO 12.5 MG/0.5ML ~~LOC~~ SOAJ
12.5000 mg | SUBCUTANEOUS | 11 refills | Status: DC
  Filled 2023-01-09: qty 6, 84d supply, fill #0

## 2023-01-09 MED ORDER — MOUNJARO 12.5 MG/0.5ML ~~LOC~~ SOAJ
12.5000 mg | SUBCUTANEOUS | 11 refills | Status: DC
  Filled 2023-01-09: qty 2, 28d supply, fill #0

## 2023-01-11 ENCOUNTER — Other Ambulatory Visit (HOSPITAL_COMMUNITY): Payer: Self-pay

## 2023-01-14 ENCOUNTER — Other Ambulatory Visit (HOSPITAL_COMMUNITY): Payer: Self-pay

## 2023-01-14 ENCOUNTER — Other Ambulatory Visit: Payer: Self-pay | Admitting: Neurology

## 2023-01-15 ENCOUNTER — Other Ambulatory Visit: Payer: Self-pay | Admitting: Neurology

## 2023-01-15 ENCOUNTER — Other Ambulatory Visit (HOSPITAL_COMMUNITY): Payer: Self-pay

## 2023-01-15 DIAGNOSIS — E119 Type 2 diabetes mellitus without complications: Secondary | ICD-10-CM

## 2023-01-15 MED ORDER — MOUNJARO 15 MG/0.5ML ~~LOC~~ SOAJ
15.0000 mg | SUBCUTANEOUS | 11 refills | Status: DC
  Filled 2023-01-15: qty 2, 28d supply, fill #0
  Filled 2023-02-14: qty 2, 28d supply, fill #1
  Filled 2023-04-03 – 2023-05-15 (×2): qty 2, 28d supply, fill #2
  Filled 2023-07-01: qty 2, 28d supply, fill #3
  Filled 2023-07-24: qty 2, 28d supply, fill #4
  Filled 2023-08-25 – 2023-08-26 (×3): qty 2, 28d supply, fill #5
  Filled 2023-08-29: qty 6, 84d supply, fill #5
  Filled 2023-11-16: qty 6, 84d supply, fill #6

## 2023-01-17 ENCOUNTER — Other Ambulatory Visit (HOSPITAL_COMMUNITY): Payer: Self-pay

## 2023-01-21 ENCOUNTER — Other Ambulatory Visit (HOSPITAL_COMMUNITY): Payer: Self-pay

## 2023-01-22 ENCOUNTER — Other Ambulatory Visit (HOSPITAL_COMMUNITY): Payer: Self-pay

## 2023-02-20 ENCOUNTER — Other Ambulatory Visit: Payer: Self-pay

## 2023-02-25 ENCOUNTER — Other Ambulatory Visit (HOSPITAL_COMMUNITY): Payer: Self-pay

## 2023-02-26 ENCOUNTER — Other Ambulatory Visit (HOSPITAL_COMMUNITY): Payer: Self-pay

## 2023-03-07 ENCOUNTER — Other Ambulatory Visit (HOSPITAL_COMMUNITY): Payer: Self-pay

## 2023-03-14 ENCOUNTER — Other Ambulatory Visit (HOSPITAL_COMMUNITY): Payer: Self-pay

## 2023-03-15 ENCOUNTER — Other Ambulatory Visit (HOSPITAL_BASED_OUTPATIENT_CLINIC_OR_DEPARTMENT_OTHER): Payer: Self-pay

## 2023-03-15 ENCOUNTER — Other Ambulatory Visit: Payer: Self-pay | Admitting: Neurology

## 2023-03-15 DIAGNOSIS — E1149 Type 2 diabetes mellitus with other diabetic neurological complication: Secondary | ICD-10-CM

## 2023-03-15 MED ORDER — MOUNJARO 12.5 MG/0.5ML ~~LOC~~ SOAJ
12.5000 mg | SUBCUTANEOUS | 3 refills | Status: DC
  Filled 2023-03-15: qty 2, 28d supply, fill #0
  Filled 2023-04-19 – 2023-04-22 (×2): qty 2, 28d supply, fill #1
  Filled 2023-05-15 – 2024-02-10 (×6): qty 2, 28d supply, fill #2

## 2023-03-21 ENCOUNTER — Other Ambulatory Visit (HOSPITAL_COMMUNITY): Payer: Self-pay

## 2023-04-03 ENCOUNTER — Other Ambulatory Visit (HOSPITAL_COMMUNITY): Payer: Self-pay

## 2023-04-04 ENCOUNTER — Other Ambulatory Visit (HOSPITAL_COMMUNITY): Payer: Self-pay

## 2023-04-19 ENCOUNTER — Other Ambulatory Visit (HOSPITAL_COMMUNITY): Payer: Self-pay

## 2023-04-19 ENCOUNTER — Other Ambulatory Visit: Payer: Self-pay

## 2023-04-22 ENCOUNTER — Other Ambulatory Visit (HOSPITAL_BASED_OUTPATIENT_CLINIC_OR_DEPARTMENT_OTHER): Payer: Self-pay

## 2023-04-22 ENCOUNTER — Other Ambulatory Visit (HOSPITAL_COMMUNITY): Payer: Self-pay

## 2023-05-16 ENCOUNTER — Other Ambulatory Visit: Payer: Self-pay

## 2023-05-16 ENCOUNTER — Encounter (HOSPITAL_COMMUNITY): Payer: Self-pay

## 2023-05-16 ENCOUNTER — Telehealth: Payer: Self-pay

## 2023-05-16 ENCOUNTER — Other Ambulatory Visit (HOSPITAL_COMMUNITY): Payer: Self-pay

## 2023-05-16 NOTE — Telephone Encounter (Signed)
I received a PA request via CMM for [employer]-the last chart notes or OV found was in Jan of 2023-Please advise

## 2023-05-20 ENCOUNTER — Other Ambulatory Visit (HOSPITAL_COMMUNITY): Payer: Self-pay

## 2023-05-20 NOTE — Telephone Encounter (Signed)
Per test claim no PA is needed at this time.  

## 2023-05-21 NOTE — Telephone Encounter (Signed)
Pt has active PA is already on file with expiration date of 01/14/2024

## 2023-06-07 ENCOUNTER — Other Ambulatory Visit (HOSPITAL_COMMUNITY): Payer: Self-pay

## 2023-07-02 ENCOUNTER — Other Ambulatory Visit (HOSPITAL_COMMUNITY): Payer: Self-pay

## 2023-07-17 ENCOUNTER — Other Ambulatory Visit (HOSPITAL_COMMUNITY): Payer: Self-pay

## 2023-07-18 ENCOUNTER — Other Ambulatory Visit: Payer: Self-pay

## 2023-07-18 ENCOUNTER — Other Ambulatory Visit (HOSPITAL_COMMUNITY): Payer: Self-pay

## 2023-07-22 ENCOUNTER — Other Ambulatory Visit (HOSPITAL_COMMUNITY): Payer: Self-pay

## 2023-07-23 ENCOUNTER — Other Ambulatory Visit (HOSPITAL_COMMUNITY): Payer: Self-pay

## 2023-07-24 ENCOUNTER — Other Ambulatory Visit (HOSPITAL_COMMUNITY): Payer: Self-pay

## 2023-08-26 ENCOUNTER — Other Ambulatory Visit (HOSPITAL_BASED_OUTPATIENT_CLINIC_OR_DEPARTMENT_OTHER): Payer: Self-pay

## 2023-08-26 ENCOUNTER — Encounter (HOSPITAL_COMMUNITY): Payer: Self-pay | Admitting: Pharmacist

## 2023-08-26 ENCOUNTER — Other Ambulatory Visit: Payer: Self-pay

## 2023-08-26 ENCOUNTER — Other Ambulatory Visit (HOSPITAL_COMMUNITY): Payer: Self-pay

## 2023-08-29 ENCOUNTER — Other Ambulatory Visit (HOSPITAL_COMMUNITY): Payer: Self-pay

## 2023-09-16 ENCOUNTER — Other Ambulatory Visit (HOSPITAL_COMMUNITY): Payer: Self-pay

## 2024-02-03 ENCOUNTER — Other Ambulatory Visit (HOSPITAL_COMMUNITY): Payer: Self-pay

## 2024-02-03 ENCOUNTER — Encounter (HOSPITAL_COMMUNITY): Payer: Self-pay

## 2024-02-03 ENCOUNTER — Other Ambulatory Visit: Payer: Self-pay | Admitting: Neurology

## 2024-02-03 DIAGNOSIS — E119 Type 2 diabetes mellitus without complications: Secondary | ICD-10-CM

## 2024-02-05 ENCOUNTER — Encounter (HOSPITAL_COMMUNITY): Payer: Self-pay

## 2024-02-05 ENCOUNTER — Other Ambulatory Visit (HOSPITAL_COMMUNITY): Payer: Self-pay

## 2024-02-10 ENCOUNTER — Other Ambulatory Visit (HOSPITAL_COMMUNITY): Payer: Self-pay

## 2024-02-17 ENCOUNTER — Other Ambulatory Visit (HOSPITAL_COMMUNITY): Payer: Self-pay

## 2024-02-17 ENCOUNTER — Other Ambulatory Visit: Payer: Self-pay | Admitting: Neurology

## 2024-02-17 DIAGNOSIS — I159 Secondary hypertension, unspecified: Secondary | ICD-10-CM

## 2024-02-17 DIAGNOSIS — E785 Hyperlipidemia, unspecified: Secondary | ICD-10-CM

## 2024-02-17 DIAGNOSIS — E119 Type 2 diabetes mellitus without complications: Secondary | ICD-10-CM

## 2024-02-17 DIAGNOSIS — I10 Essential (primary) hypertension: Secondary | ICD-10-CM

## 2024-02-17 MED ORDER — MOUNJARO 15 MG/0.5ML ~~LOC~~ SOAJ
15.0000 mg | SUBCUTANEOUS | 11 refills | Status: DC
  Filled 2024-02-17: qty 6, 84d supply, fill #0

## 2024-02-17 MED ORDER — LISINOPRIL 20 MG PO TABS
20.0000 mg | ORAL_TABLET | Freq: Every day | ORAL | 4 refills | Status: AC
Start: 2024-02-17 — End: ?
  Filled 2024-02-17: qty 90, 90d supply, fill #0
  Filled 2024-06-07: qty 90, 90d supply, fill #1
  Filled 2024-09-04: qty 90, 90d supply, fill #2
  Filled ????-??-??: fill #3

## 2024-02-17 MED ORDER — MOUNJARO 15 MG/0.5ML ~~LOC~~ SOAJ
15.0000 mg | SUBCUTANEOUS | 11 refills | Status: AC
  Filled 2024-02-17: qty 2, 28d supply, fill #0
  Filled 2024-02-20: qty 6, 84d supply, fill #0
  Filled 2024-05-10 – 2024-05-11 (×2): qty 2, 28d supply, fill #1
  Filled 2024-06-07: qty 2, 28d supply, fill #2
  Filled 2024-07-14: qty 2, 28d supply, fill #3
  Filled 2024-08-17: qty 2, 28d supply, fill #4
  Filled 2024-09-04 – 2024-09-07 (×2): qty 2, 28d supply, fill #5
  Filled 2024-10-01: qty 2, 28d supply, fill #6
  Filled 2024-11-05: qty 2, 28d supply, fill #7
  Filled 2024-12-02: qty 2, 28d supply, fill #8
  Filled ????-??-??: fill #9

## 2024-02-18 ENCOUNTER — Other Ambulatory Visit (HOSPITAL_COMMUNITY): Payer: Self-pay

## 2024-02-20 ENCOUNTER — Other Ambulatory Visit (HOSPITAL_COMMUNITY): Payer: Self-pay

## 2024-02-21 ENCOUNTER — Other Ambulatory Visit (HOSPITAL_COMMUNITY): Payer: Self-pay

## 2024-05-11 ENCOUNTER — Other Ambulatory Visit (HOSPITAL_COMMUNITY): Payer: Self-pay

## 2024-06-08 ENCOUNTER — Other Ambulatory Visit (HOSPITAL_COMMUNITY): Payer: Self-pay

## 2024-06-08 ENCOUNTER — Other Ambulatory Visit: Payer: Self-pay

## 2024-09-04 ENCOUNTER — Other Ambulatory Visit (HOSPITAL_COMMUNITY): Payer: Self-pay

## 2024-09-04 ENCOUNTER — Other Ambulatory Visit: Payer: Self-pay
